# Patient Record
Sex: Female | Born: 1960 | Race: White | Hispanic: No | Smoking: Never smoker
Health system: Southern US, Community
[De-identification: ages and names within clinical notes are randomized; demographics above are authoritative.]

## PROBLEM LIST (undated history)

## (undated) DIAGNOSIS — E785 Hyperlipidemia, unspecified: Secondary | ICD-10-CM

## (undated) DIAGNOSIS — Z8619 Personal history of other infectious and parasitic diseases: Secondary | ICD-10-CM

## (undated) DIAGNOSIS — G43009 Migraine without aura, not intractable, without status migrainosus: Secondary | ICD-10-CM

## (undated) DIAGNOSIS — E663 Overweight: Secondary | ICD-10-CM

## (undated) HISTORY — DX: Overweight: E66.3

## (undated) HISTORY — DX: Hyperlipidemia, unspecified: E78.5

## (undated) HISTORY — PX: TUBAL LIGATION: SHX77

## (undated) HISTORY — PX: BREAST BIOPSY: SHX20

## (undated) HISTORY — DX: Migraine without aura, not intractable, without status migrainosus: G43.009

## (undated) HISTORY — DX: Personal history of other infectious and parasitic diseases: Z86.19

---

## 2005-08-04 ENCOUNTER — Ambulatory Visit: Payer: Self-pay | Admitting: General Surgery

## 2006-08-16 ENCOUNTER — Ambulatory Visit: Payer: Self-pay | Admitting: General Surgery

## 2007-08-17 ENCOUNTER — Ambulatory Visit: Payer: Self-pay | Admitting: General Surgery

## 2008-09-11 ENCOUNTER — Ambulatory Visit: Payer: Self-pay | Admitting: General Surgery

## 2009-09-12 ENCOUNTER — Ambulatory Visit: Payer: Self-pay | Admitting: General Surgery

## 2010-07-12 ENCOUNTER — Emergency Department: Payer: Self-pay | Admitting: Emergency Medicine

## 2010-09-14 ENCOUNTER — Ambulatory Visit: Payer: Self-pay | Admitting: General Surgery

## 2011-10-11 LAB — HM DIABETES FOOT EXAM

## 2011-10-12 ENCOUNTER — Ambulatory Visit: Payer: Self-pay | Admitting: General Surgery

## 2011-10-12 LAB — HM PAP SMEAR: HM Pap smear: NORMAL

## 2012-05-09 ENCOUNTER — Emergency Department: Payer: Self-pay | Admitting: Emergency Medicine

## 2012-07-26 HISTORY — PX: CERVICAL POLYPECTOMY: SHX88

## 2013-05-30 DIAGNOSIS — B0052 Herpesviral keratitis: Secondary | ICD-10-CM | POA: Insufficient documentation
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
## 2014-12-23 ENCOUNTER — Encounter: Payer: Self-pay | Admitting: Family Medicine

## 2014-12-23 ENCOUNTER — Encounter (INDEPENDENT_AMBULATORY_CARE_PROVIDER_SITE_OTHER): Payer: Self-pay

## 2014-12-23 ENCOUNTER — Other Ambulatory Visit: Payer: Self-pay | Admitting: Family Medicine

## 2014-12-23 ENCOUNTER — Ambulatory Visit (INDEPENDENT_AMBULATORY_CARE_PROVIDER_SITE_OTHER): Payer: BLUE CROSS/BLUE SHIELD | Admitting: Family Medicine

## 2014-12-23 VITALS — BP 122/74 | HR 75 | Temp 98.5°F | Resp 14 | Ht 64.0 in | Wt 167.5 lb

## 2014-12-23 DIAGNOSIS — R635 Abnormal weight gain: Secondary | ICD-10-CM | POA: Diagnosis not present

## 2014-12-23 DIAGNOSIS — J309 Allergic rhinitis, unspecified: Secondary | ICD-10-CM | POA: Insufficient documentation

## 2014-12-23 DIAGNOSIS — Z8619 Personal history of other infectious and parasitic diseases: Secondary | ICD-10-CM | POA: Insufficient documentation

## 2014-12-23 DIAGNOSIS — E785 Hyperlipidemia, unspecified: Secondary | ICD-10-CM

## 2014-12-23 DIAGNOSIS — F329 Major depressive disorder, single episode, unspecified: Secondary | ICD-10-CM | POA: Insufficient documentation

## 2014-12-23 DIAGNOSIS — Z0000 Encounter for general adult medical examination without abnormal findings: Secondary | ICD-10-CM

## 2014-12-23 DIAGNOSIS — Z1239 Encounter for other screening for malignant neoplasm of breast: Secondary | ICD-10-CM | POA: Diagnosis not present

## 2014-12-23 DIAGNOSIS — D229 Melanocytic nevi, unspecified: Secondary | ICD-10-CM

## 2014-12-23 DIAGNOSIS — Z7189 Other specified counseling: Secondary | ICD-10-CM | POA: Diagnosis not present

## 2014-12-23 DIAGNOSIS — Z1211 Encounter for screening for malignant neoplasm of colon: Secondary | ICD-10-CM

## 2014-12-23 DIAGNOSIS — F419 Anxiety disorder, unspecified: Secondary | ICD-10-CM

## 2014-12-23 DIAGNOSIS — Z79899 Other long term (current) drug therapy: Secondary | ICD-10-CM | POA: Diagnosis not present

## 2014-12-23 DIAGNOSIS — Z01419 Encounter for gynecological examination (general) (routine) without abnormal findings: Secondary | ICD-10-CM

## 2014-12-23 DIAGNOSIS — Z8742 Personal history of other diseases of the female genital tract: Secondary | ICD-10-CM | POA: Diagnosis not present

## 2014-12-23 DIAGNOSIS — Z124 Encounter for screening for malignant neoplasm of cervix: Secondary | ICD-10-CM

## 2014-12-23 DIAGNOSIS — Z719 Counseling, unspecified: Secondary | ICD-10-CM

## 2014-12-23 DIAGNOSIS — G43009 Migraine without aura, not intractable, without status migrainosus: Secondary | ICD-10-CM | POA: Insufficient documentation

## 2014-12-23 DIAGNOSIS — G4700 Insomnia, unspecified: Secondary | ICD-10-CM | POA: Insufficient documentation

## 2014-12-23 DIAGNOSIS — J4520 Mild intermittent asthma, uncomplicated: Secondary | ICD-10-CM | POA: Insufficient documentation

## 2014-12-23 MED ORDER — LORCASERIN HCL 10 MG PO TABS: 1.0000 | ORAL_TABLET | Freq: Two times a day (BID) | ORAL | Status: DC

## 2014-12-23 NOTE — Patient Instructions (Signed)

## 2014-12-23 NOTE — Progress Notes (Signed)
Name: Gail Ross   MRN: [id number]    DOB: 1961-01-03   Date:12/23/2014       Progress Note  Subjective  Chief Complaint  Chief Complaint  Patient presents with  . Annual Exam    HPI   Well Woman exam: she has noticed that since she gained weight she has been feeling more tired.  She dislikes the fact that her job makes her more sedentary and she has also noticed that she has been snacking more and has been getting frustrated about it. Drinking mostly water.  She has not been sexually active since 2013 .  Patient Active Problem List   Diagnosis Date Noted  . Migraine without aura, not intractable 12/23/2014  . Mild intermittent asthma 12/23/2014  . Overweight 12/23/2014  . Anxiety and depression 12/23/2014  . Controlled insomnia 12/23/2014  . Hyperlipidemia 12/23/2014  . Allergic rhinitis 12/23/2014  . History of shingles 12/23/2014  . History of postmenopausal bleeding 12/23/2014    Past Surgical History  Procedure Laterality Date  . Tubal ligation    . Cervical polypectomy  07/26/12  . Breast biopsy      Family History  Problem Relation Age of Onset  . Hypertension Mother   . Heart attack Father     History   Social History  . Marital Status: Married    Spouse Name: N/A  . Number of Children: N/A  . Years of Education: N/A   Occupational History  . Not on file.   Social History Main Topics  . Smoking status: Never Smoker   . Smokeless tobacco: Never Used  . Alcohol Use: 0.0 oz/week    0 Standard drinks or equivalent per week     Comment: occasionally  . Drug Use: No  . Sexual Activity: Not Currently   Other Topics Concern  . Not on file   Social History Narrative     Current outpatient prescriptions:  .  cetirizine (ZYRTEC) 10 MG tablet, Take 10 mg by mouth daily., Disp: , Rfl:  .  montelukast (SINGULAIR) 10 MG tablet, Take 10 mg by mouth at bedtime., Disp: , Rfl:  .  albuterol (VENTOLIN HFA) 108 (90 BASE) MCG/ACT inhaler, Inhale 2 puffs  into the lungs 30 (thirty) minutes before procedure., Disp: , Rfl:  .  Fluticasone-Salmeterol (ADVAIR) 250-50 MCG/DOSE AEPB, Inhale 1 puff into the lungs 2 (two) times daily., Disp: , Rfl:  .  SUMAtriptan Succinate Refill 6 MG/0.5ML SOCT, Inject 6 mg into the skin daily., Disp: , Rfl: 0  Allergies  Allergen Reactions  . Penicillins   . Tree Extract      ROS  Constitutional: Negative for fever but has noticed  weight gain  Respiratory: Negative for cough and shortness of breath.   Cardiovascular: Negative for chest pain or palpitations.  Gastrointestinal: Negative for abdominal pain, no bowel changes.  Musculoskeletal: Negative for gait problem or joint swelling.  Skin: Negative for rash.  Neurological: Negative for dizziness. Headache about 4 times monthly.  No other specific complaints in a complete review of systems (except as listed in HPI above).  Objective  Filed Vitals:   12/23/14 1351  BP: 122/74  Pulse: 75  Temp: 98.5 F (36.9 C)  TempSrc: Oral  Resp: 14  Height: 5\' 4"  (1.626 m)  Weight: 167 lb 8 oz (75.978 kg)  SpO2: 96%    Body mass index is 28.74 kg/(m^2).  Physical Exam   Constitutional: Patient appears well-developed and well-nourished. No distress.  HENT: Head:  Normocephalic and atraumatic. Ears: B TMs ok, no erythema or effusion; Nose: Nose normal. Mouth/Throat: Oropharynx is clear and moist. No oropharyngeal exudate.  Eyes: Conjunctivae and EOM are normal. Pupils are equal, round, and reactive to light. No scleral icterus.  Neck: Normal range of motion. Neck supple. No JVD present. No thyromegaly present.  Cardiovascular: Normal rate, regular rhythm and normal heart sounds.  No murmur heard. No BLE edema. Pulmonary/Chest: Effort normal and breath sounds normal. No respiratory distress. Abdominal: Soft. Bowel sounds are normal, no distension. There is no tenderness. no masses Breast: no lumps or masses, no nipple discharge or rashes FEMALE GENITALIA:   External genitalia normal External urethra normal Vaginal vault normal without discharge or lesions Cervix normal without discharge or lesions Bimanual exam normal without masses Musculoskeletal: Normal range of motion, no joint effusions. No gross deformities Neurological: he is alert and oriented to person, place, and time. No cranial nerve deficit. Coordination, balance, strength, speech and gait are normal.  Skin: Skin is warm and dry. No rash noted. No erythema.  Psychiatric: Patient has a normal mood and affect. behavior is normal. Judgment and thought content normal.      PHQ2/9: Depression screen PHQ 2/9 12/23/2014  Decreased Interest 0  Down, Depressed, Hopeless 0  PHQ - 2 Score 0     Fall Risk: Fall Risk  12/23/2014  Falls in the past year? No    Assessment & Plan  1. Well woman exam Discussed health counseling  2. Health counseling  Discussed importance of 150 minutes of physical activity weekly, eat two servings of fish weekly, eat one serving of tree nuts ( cashews, pistachios, pecans, almonds.Marland Kitchen) every other day, eat 6 servings of fruit/vegetables daily and drink plenty of water and avoid sweet beverages. Start asrpin 81 mg daily    3. Colon cancer screening  - Ambulatory referral to Gastroenterology  4. Breast cancer screening  - MM Digital Diagnostic Bilat; Future   5. Cervical cancer screening - Cytology - PAP  6. History of postmenopausal bleeding Neg evaluation by gyn  7. Weight gain  - TSH, she is overweight, and wants to take medication for weight loss, her BMI 28 but she has hyperlipidemia as a risk factor, discussed options , risk and benefit, increase physical activity and follow up in 6 weeks  8. Encounter for long-term (current) use of medications  - Comprehensive Metabolic Panel (CMET)  9. Hyperlipidemia  - Lipid Profile  10. Numerous moles  Dr. Koleen Nimrod

## 2014-12-27 LAB — PAP LB, RFX HPV ASCU: PAP SMEAR COMMENT: 0

## 2015-01-03 ENCOUNTER — Telehealth: Payer: Self-pay

## 2015-01-03 NOTE — Telephone Encounter (Signed)
LVM for pt to return my call to schedule colonoscopy.  

## 2015-01-06 NOTE — Telephone Encounter (Signed)
Mailed letter requesting pt to call and schedule colonoscopy.

## 2015-02-27 ENCOUNTER — Ambulatory Visit: Payer: Self-pay | Admitting: Family Medicine

## 2015-03-12 ENCOUNTER — Ambulatory Visit: Payer: Self-pay | Admitting: Family Medicine

## 2015-07-14 ENCOUNTER — Other Ambulatory Visit: Payer: Self-pay | Admitting: Family Medicine

## 2015-07-15 NOTE — Telephone Encounter (Signed)
Patient requesting refill. 

## 2015-12-18 ENCOUNTER — Encounter: Payer: Self-pay | Admitting: Podiatry

## 2015-12-18 ENCOUNTER — Ambulatory Visit (INDEPENDENT_AMBULATORY_CARE_PROVIDER_SITE_OTHER): Payer: BLUE CROSS/BLUE SHIELD | Admitting: Podiatry

## 2015-12-18 ENCOUNTER — Ambulatory Visit (INDEPENDENT_AMBULATORY_CARE_PROVIDER_SITE_OTHER): Payer: BLUE CROSS/BLUE SHIELD

## 2015-12-18 VITALS — BP 108/74 | HR 105 | Resp 18

## 2015-12-18 DIAGNOSIS — M205X1 Other deformities of toe(s) (acquired), right foot: Secondary | ICD-10-CM | POA: Diagnosis not present

## 2015-12-18 DIAGNOSIS — R52 Pain, unspecified: Secondary | ICD-10-CM

## 2015-12-18 DIAGNOSIS — M779 Enthesopathy, unspecified: Secondary | ICD-10-CM

## 2015-12-18 NOTE — Patient Instructions (Signed)
1st metatarsophalangeal (MTPJ) joint implant for hallux limitus

## 2015-12-18 NOTE — Progress Notes (Signed)
   Subjective:    Patient ID: Gail Ross, female    DOB: [birth year], 55 y.o.   MRN: [id number]  HPI  55 year old female presents to the office for concerns of a possible bunion to her right foot which is been ongoing for about 1 year and has been progressive. She gets pain to the big toe joint which he tries to move her toe and she is unable to wear healed shoes or flat shoe. She has had no recent injury. No numbness or tingling. No recent treatment. No other complaints at this time.  Review of Systems  All other systems reviewed and are negative.      Objective:   Physical Exam Ross: AAO x3, NAD  Dermatological: Skin is warm, dry and supple bilateral. Nails x 10 are well manicured; remaining integument appears unremarkable at this time. There are no open sores, no preulcerative lesions, no rash or signs of infection present.  Vascular: Dorsalis Pedis artery and Posterior Tibial artery pedal pulses are 2/4 bilateral with immedate capillary fill time. Pedal hair growth present.  There is no pain with calf compression, swelling, warmth, erythema.   Neruologic: Grossly intact via light touch bilateral. Vibratory intact via tuning fork bilateral. Protective threshold with Semmes Wienstein monofilament intact to all pedal sites bilateral.  Musculoskeletal: There is a decreased range of motion of the right first MTPJ and there is crepitation with MPJ range of motion. There is a palpable spur off the dorsal aspect of the first metatarsal head. There is no erythema or increase in warmth. There is tenderness the first MPJ. No other areas of tenderness bilaterally.   Gait: Unassisted, Nonantalgic.      Assessment & Plan:  Right first MTPJ hallux limitus, capsulitis -Treatment options discussed including all alternatives, risks, and complications -Etiology of symptoms were discussed -X-rays were obtained and reviewed with the patient. Arthritic changes present in the first  MPJ. -Discussed steroid injection the joint for which she wishes to proceed with this. Under sterile conditions a mixture of dexamethasone phosphate and local anesthetic was infiltrated without, occasions. Post injection care was discussed -Discussed orthotics but she wishes to hold off -Discussed shoe gear modifications and padding. -Discussed surgical intervention she will consider this in the future should symptoms persist. -Follow-up as scheduled or sooner if needed.  Celesta Gentile, DPM

## 2015-12-21 DIAGNOSIS — M205X9 Other deformities of toe(s) (acquired), unspecified foot: Secondary | ICD-10-CM | POA: Insufficient documentation

## 2015-12-25 ENCOUNTER — Other Ambulatory Visit: Payer: Self-pay | Admitting: Family Medicine

## 2015-12-25 NOTE — Telephone Encounter (Signed)
Patient requesting refill. 

## 2016-01-05 ENCOUNTER — Telehealth: Payer: Self-pay | Admitting: *Deleted

## 2016-01-05 NOTE — Telephone Encounter (Signed)
"  My name is Gail Ross.  Thank you.

## 2016-01-06 NOTE — Telephone Encounter (Signed)
I'm returning your call from yesterday.  "Yes, I would like to go ahead and set up my surgery.  I'm going to see him on Thursday to discuss it.  Do I need to wait and see him to schedule?"  It would be best to wait until you see him to get it scheduled.  "Will they schedule it that day or will I need to call you to set it up?"  They will probably bring me the surgery information on Friday.  "So will I call you to schedule or will you call me?"  You can call me to schedule the date.  "Can you at least give me an idea of when his next available date is?"  His next available date is July 12.  "I guess I will have to wait until then.  Thank you so much."

## 2016-01-08 ENCOUNTER — Encounter: Payer: Self-pay | Admitting: Podiatry

## 2016-01-08 ENCOUNTER — Ambulatory Visit (INDEPENDENT_AMBULATORY_CARE_PROVIDER_SITE_OTHER): Payer: BLUE CROSS/BLUE SHIELD | Admitting: Podiatry

## 2016-01-08 DIAGNOSIS — M205X1 Other deformities of toe(s) (acquired), right foot: Secondary | ICD-10-CM

## 2016-01-08 DIAGNOSIS — M779 Enthesopathy, unspecified: Secondary | ICD-10-CM

## 2016-01-08 NOTE — Patient Instructions (Signed)

## 2016-01-09 NOTE — Progress Notes (Signed)
Patient ID: Gail Ross, female   DOB: 1961-02-26, 55 y.o.   MRN: [id number]  Subjective: 55 year old female presents the office sooner. Evaluation of right first MTPJ pain. She states the injection did not help. She continues to have pain to the joint when she walks on daily basis. This time should proceed with surgical intervention. She has tried conservative treatment including shoe gear changes, offloading of having without any relief as well as injections. Denies any systemic complaints such as fevers, chills, nausea, vomiting. No acute changes since last appointment, and no other complaints at this time.   Objective: AAO x3, NAD DP/PT pulses palpable bilaterally, CRT less than 3 seconds There is decreased range of motion the first MTPJ on the right side and there is crepitus with first MPJ range of motion. There is a decreased range of motion as well. There is a dorsal exostosis of the dorsal first MPJ. Tenderness the first MPJ. No other areas of tenderness to bilateral lower extremity is. There is localized erythema to the dorsal aspect of the joint overlying the bony exostosis from irritation shoes. No open lesions or pre-ulcerative lesions.  No pain with calf compression, swelling, warmth, erythema  Assessment:  55 year old female right hallux limitus, symptomatic   Plan: -All treatment options discussed with the patient including all alternatives, risks, complications.  -At this time a discussed both conservative and surgical treatment options. She wishes to proceed with surgical intervention. I discussed arthrodesis versus arthroplasty with implant. At this point she does not want have her toe. We will proceed with an implant arthroplasty.  -The incision placement as well as the postoperative course was discussed with the patient. I discussed risks of the surgery which include, but not limited to, infection, bleeding, pain, swelling, need for further surgery, delayed or nonhealing,  painful or ugly scar, numbness or sensation changes, over/under correction, recurrence, transfer lesions, further deformity, hardware failure, DVT/PE, loss of toe/foot. Patient understands these risks and wishes to proceed with surgery. The surgical consent was reviewed with the patient all 3 pages were signed. No promises or guarantees were given to the outcome of the procedure. All questions were answered to the best of my ability. Before the surgery the patient was encouraged to call the office if there is any further questions. The surgery will be performed at the [facility] on an outpatient basis. -Cam boot dispensed.  Celesta Gentile, DPM -Patient encouraged to call the office with any questions, concerns, change in symptoms.

## 2016-01-19 DIAGNOSIS — M79673 Pain in unspecified foot: Secondary | ICD-10-CM

## 2016-01-21 ENCOUNTER — Encounter: Payer: Self-pay | Admitting: Podiatry

## 2016-01-21 DIAGNOSIS — M2021 Hallux rigidus, right foot: Secondary | ICD-10-CM | POA: Diagnosis not present

## 2016-01-21 HISTORY — PX: OTHER SURGICAL HISTORY: SHX169

## 2016-01-23 ENCOUNTER — Telehealth: Payer: Self-pay | Admitting: *Deleted

## 2016-01-23 ENCOUNTER — Other Ambulatory Visit: Payer: Self-pay | Admitting: Sports Medicine

## 2016-01-23 DIAGNOSIS — M79673 Pain in unspecified foot: Secondary | ICD-10-CM

## 2016-01-23 MED ORDER — OXYCODONE-ACETAMINOPHEN 10-325 MG PO TABS: 1.0000 | ORAL_TABLET | ORAL | Status: DC | PRN

## 2016-01-23 NOTE — Progress Notes (Signed)
DOS 01/21/2016 Right foot removal of big toe joint with implant.

## 2016-01-23 NOTE — Telephone Encounter (Addendum)
Post op courtesy call-Pt states her foot is throbbing and the Percocet is not touching the pain.  I instructed pt to remove the air fracture walker, open-ended sock, and ace wrap only, elevate the foot for 15 minutes, if pain worsens dangle foot 15 minutes, then place foot level with hip and rewrap the ace looser. I told pt I'd call again in about 15-20 minutes, pt agreed.  Follow up call, left message I would call again or she could call me. Pt called back after lunch states tried all of the comfort measures but no relief.  Dr. Jacqualyn Posey orders Percocet 10/325mg  #30 one every 4-6 hours. I spoke with pt and informed that Dr. Jacqualyn Posey had increase the Percocet strength to Percocet 10/353m one tablet every 4-6 hours and she would need to pick it up in the [place] office before 300pm.  I routed message to Dr. Cannon Kettle of pt presenting for changed rx. I spoke with [facility] and informed that pt would have an increase in her Percocet strength and our doctor had ordered.

## 2016-01-23 NOTE — Telephone Encounter (Signed)
Will do!

## 2016-01-29 ENCOUNTER — Encounter: Payer: Self-pay | Admitting: Podiatry

## 2016-01-29 ENCOUNTER — Ambulatory Visit (INDEPENDENT_AMBULATORY_CARE_PROVIDER_SITE_OTHER): Payer: BLUE CROSS/BLUE SHIELD

## 2016-01-29 ENCOUNTER — Ambulatory Visit (INDEPENDENT_AMBULATORY_CARE_PROVIDER_SITE_OTHER): Payer: BLUE CROSS/BLUE SHIELD | Admitting: Podiatry

## 2016-01-29 DIAGNOSIS — M205X1 Other deformities of toe(s) (acquired), right foot: Secondary | ICD-10-CM | POA: Diagnosis not present

## 2016-01-29 DIAGNOSIS — Z9889 Other specified postprocedural states: Secondary | ICD-10-CM

## 2016-01-29 NOTE — Progress Notes (Signed)
Patient ID: Gail Ross, female   DOB: 05/17/1961, 55 y.o.   MRN: [id number]  Subjective: Gail Ross is a 55 y.o. is seen today in office s/p right foot 1st MTPJ implant arthroplasty preformed on 01/21/16. She states that her pain is much better controlled. She is taking maybe 1 Percocet a day if needed. She does take the boot off to rest and this also helps with the pain.  Denies any systemic complaints such as fevers, chills, nausea, vomiting. No calf pain, chest pain, shortness of breath.   Objective: General: No acute distress, AAOx3  DP/PT pulses palpable 2/4, CRT < 3 sec to all digits.  Protective sensation intact. Motor function intact.  Right foot: Incision is well coapted without any evidence of dehiscence and sutures intact. There is no surrounding erythema, ascending cellulitis, fluctuance, crepitus, malodor, drainage/purulence. There is minimal edema around the surgical site. There is mild pain along the surgical site. Slight discomfort with 1st MTPJ ROM but she states it feels different than before surgery.  No other areas of tenderness to bilateral lower extremities.  No other open lesions or pre-ulcerative lesions.  No pain with calf compression, swelling, warmth, erythema.   Assessment and Plan:  Status post right foot surgery, doing well with no complications   -Treatment options discussed including all alternatives, risks, and complications -X-rays were obtained and reviewed with the patient. Implant arthroplasty evident the right first MPJ. Hardware appears intact. No evidence of acute fracture. -Antibiotic ointment was applied over the incision followed by dressing. Keep dressing clean, dry, intact. -Ice/elevation -Pain medication as needed. -Monitor for any clinical signs or symptoms of infection and DVT/PE and directed to call the office immediately should any occur or go to the ER. -Follow-up in 1 week for suture removal or sooner if any problems arise. In the  meantime, encouraged to call the office with any questions, concerns, change in symptoms.  *surgical shoe next appointment  Celesta Gentile, DPM

## 2016-02-05 ENCOUNTER — Encounter: Payer: Self-pay | Admitting: Podiatry

## 2016-02-05 ENCOUNTER — Ambulatory Visit (INDEPENDENT_AMBULATORY_CARE_PROVIDER_SITE_OTHER): Payer: BLUE CROSS/BLUE SHIELD | Admitting: Podiatry

## 2016-02-05 ENCOUNTER — Ambulatory Visit (INDEPENDENT_AMBULATORY_CARE_PROVIDER_SITE_OTHER): Payer: BLUE CROSS/BLUE SHIELD

## 2016-02-05 ENCOUNTER — Telehealth: Payer: Self-pay | Admitting: Podiatry

## 2016-02-05 VITALS — BP 121/69 | HR 88 | Resp 12

## 2016-02-05 DIAGNOSIS — M205X1 Other deformities of toe(s) (acquired), right foot: Secondary | ICD-10-CM

## 2016-02-05 DIAGNOSIS — Z9889 Other specified postprocedural states: Secondary | ICD-10-CM

## 2016-02-05 MED ORDER — CLINDAMYCIN HCL 300 MG PO CAPS: 300.0000 mg | ORAL_CAPSULE | Freq: Three times a day (TID) | ORAL | 2 refills | Status: DC

## 2016-02-05 NOTE — Telephone Encounter (Signed)
Patient came in today and said that [employer] has faxed you paperwork to complete for Gail Ross and return with office notes/ records etc and they have not received them. Patient said they will discontinue her FMLA and pay on Tuesday if they dont get this information this week. Please call patient to discuss.  [employer] fax number is [phone] and the claim # is [id number]. Thank you!

## 2016-02-05 NOTE — Telephone Encounter (Signed)
The forms were completed and faxed on 02/03/16
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
## 2016-02-09 ENCOUNTER — Telehealth: Payer: Self-pay | Admitting: Podiatry

## 2016-02-12 NOTE — Telephone Encounter (Signed)
Sent to [employer] to fax over office notes to her carrier.

## 2016-02-19 ENCOUNTER — Telehealth: Payer: Self-pay | Admitting: Podiatry

## 2016-02-19 ENCOUNTER — Ambulatory Visit (INDEPENDENT_AMBULATORY_CARE_PROVIDER_SITE_OTHER): Payer: BLUE CROSS/BLUE SHIELD | Admitting: Podiatry

## 2016-02-19 ENCOUNTER — Ambulatory Visit (INDEPENDENT_AMBULATORY_CARE_PROVIDER_SITE_OTHER): Payer: BLUE CROSS/BLUE SHIELD

## 2016-02-19 DIAGNOSIS — Z09 Encounter for follow-up examination after completed treatment for conditions other than malignant neoplasm: Secondary | ICD-10-CM

## 2016-02-19 DIAGNOSIS — M205X1 Other deformities of toe(s) (acquired), right foot: Secondary | ICD-10-CM

## 2016-02-19 NOTE — Telephone Encounter (Signed)
Patient was seen by dr. Jacqualyn Posey today and she would like you to send office notes... Etc... ( whatever you normally send to [facility]) on her behalf to extend her. She will return in 3 weeks on 03/11/16 for PO #4. Thanks!

## 2016-02-19 NOTE — Progress Notes (Signed)
Patient ID: Gail Ross, female   DOB: 1961/03/23, 55 y.o.   MRN: [id number]  Subjective: Gail Ross is a 55 y.o. is seen today in office s/p right foot 1st MTPJ implant arthroplasty preformed on 01/21/16. She states that she is doing well. She still has some numbness over the big toe. She is not taking pain medication. She has continued with the surgery shoe. Denies any systemic complaints such as fevers, chills, nausea, vomiting. No calf pain, chest pain, shortness of breath.   Objective: General: No acute distress, AAOx3  DP/PT pulses palpable 2/4, CRT < 3 sec to all digits.  Protective sensation intact. Motor function intact.  Right foot: Incision is well coapted without any evidence of dehiscence and a scar has formed. There is no surrounding erythema or a a sitting cellulitis. There is no fluctuance or crepitus. No malodor or drainage. Subjective numbness in the dorsal aspect of the surgical site. No pain with MPJ range of motion. There is no blistering formation today there is no clinical signs of infection. The area of previous concern has resolved.  No other areas of tenderness to bilateral lower extremities.  No other open lesions or pre-ulcerative lesions.  No pain with calf compression, swelling, warmth, erythema.   Assessment and Plan:  Status post right foot surgery  -Treatment options discussed including all alternatives, risks, and complications -x-rays obtained today. No evidence of acute fracture. Hardware intact. -At this time she is to transition to a regular shoe as tolerated. She's been a sneaker now high-heeled her dress shoes. -Range of motion exercises. -At this time recommended her to hold off on return to work as she is unable to wear a work shoe. -Continue with elevation and ice -Follow-up in 3 weeks or sooner if needed. Call the questions concerns.  Celesta Gentile, DPM

## 2016-03-09 ENCOUNTER — Other Ambulatory Visit: Payer: Self-pay | Admitting: Family Medicine

## 2016-03-09 NOTE — Telephone Encounter (Signed)
Last seen 12/26/14 last filled 06/17 50ml 0 refills

## 2016-03-10 NOTE — Telephone Encounter (Signed)
GAVE PATIENT APPT

## 2016-03-11 ENCOUNTER — Ambulatory Visit (INDEPENDENT_AMBULATORY_CARE_PROVIDER_SITE_OTHER): Payer: BLUE CROSS/BLUE SHIELD | Admitting: Podiatry

## 2016-03-11 ENCOUNTER — Encounter: Payer: Self-pay | Admitting: Podiatry

## 2016-03-11 ENCOUNTER — Ambulatory Visit (INDEPENDENT_AMBULATORY_CARE_PROVIDER_SITE_OTHER): Payer: BLUE CROSS/BLUE SHIELD

## 2016-03-11 DIAGNOSIS — M205X1 Other deformities of toe(s) (acquired), right foot: Secondary | ICD-10-CM

## 2016-03-11 DIAGNOSIS — M2021 Hallux rigidus, right foot: Secondary | ICD-10-CM | POA: Diagnosis not present

## 2016-03-11 DIAGNOSIS — Z09 Encounter for follow-up examination after completed treatment for conditions other than malignant neoplasm: Secondary | ICD-10-CM

## 2016-03-15 NOTE — Progress Notes (Signed)
Patient ID: Gail Ross, female   DOB: 05-01-1961, 55 y.o.   MRN: [id number]  Subjective: Gail Ross is a 55 y.o. is seen today in office s/p right foot 1st MTPJ implant arthroplasty preformed on 01/21/16. She states that she is doing well and feels that she feels much improved than she did prior to surgery and she is glad that she did the surgery. She has not yet returned to work. She is wearing a slip on shoe.Denies any systemic complaints such as fevers, chills, nausea, vomiting. No calf pain, chest pain, shortness of breath.   Objective: General: No acute distress, AAOx3  DP/PT pulses palpable 2/4, CRT < 3 sec to all digits.  Protective sensation intact. Motor function intact.  Right foot: Incision is well coapted without any evidence of dehiscence and a scar has formed. There is no surrounding erythema or a a sitting cellulitis. There is no fluctuance or crepitus. No malodor or drainage. The toe/dorsal incision is improving. No pain with MPJ range of motion. The surrounding skin is a normal in appearance. No other areas of tenderness to bilateral lower extremities.  No other open lesions or pre-ulcerative lesions.  No pain with calf compression, swelling, warmth, erythema.   Assessment and Plan:  Status post right foot surgery  -Treatment options discussed including all alternatives, risks, and complications -X-rays obtained today. No evidence of acute fracture. Implant intact. -Continue range of motion exercises. She can transition to regular shoe as tolerated. Continue ice and elevation. She is scheduled about a work in the next 2 weeks. Follow-up with me as scheduled or sooner if needed.  Celesta Gentile, DPM

## 2016-04-05 ENCOUNTER — Other Ambulatory Visit: Payer: Self-pay

## 2016-04-05 ENCOUNTER — Encounter: Payer: Self-pay | Admitting: Family Medicine

## 2016-04-05 ENCOUNTER — Ambulatory Visit (INDEPENDENT_AMBULATORY_CARE_PROVIDER_SITE_OTHER): Payer: BLUE CROSS/BLUE SHIELD | Admitting: Family Medicine

## 2016-04-05 VITALS — BP 122/68 | HR 62 | Temp 98.8°F | Resp 16 | Ht 64.0 in | Wt 180.1 lb

## 2016-04-05 DIAGNOSIS — G43009 Migraine without aura, not intractable, without status migrainosus: Secondary | ICD-10-CM | POA: Diagnosis not present

## 2016-04-05 DIAGNOSIS — E669 Obesity, unspecified: Secondary | ICD-10-CM | POA: Diagnosis not present

## 2016-04-05 DIAGNOSIS — R5383 Other fatigue: Secondary | ICD-10-CM

## 2016-04-05 DIAGNOSIS — J4541 Moderate persistent asthma with (acute) exacerbation: Secondary | ICD-10-CM | POA: Diagnosis not present

## 2016-04-05 DIAGNOSIS — H9192 Unspecified hearing loss, left ear: Secondary | ICD-10-CM

## 2016-04-05 DIAGNOSIS — Z1211 Encounter for screening for malignant neoplasm of colon: Secondary | ICD-10-CM | POA: Diagnosis not present

## 2016-04-05 DIAGNOSIS — E785 Hyperlipidemia, unspecified: Secondary | ICD-10-CM

## 2016-04-05 DIAGNOSIS — R635 Abnormal weight gain: Secondary | ICD-10-CM

## 2016-04-05 DIAGNOSIS — Z23 Encounter for immunization: Secondary | ICD-10-CM | POA: Diagnosis not present

## 2016-04-05 DIAGNOSIS — Z131 Encounter for screening for diabetes mellitus: Secondary | ICD-10-CM

## 2016-04-05 DIAGNOSIS — Z79899 Other long term (current) drug therapy: Secondary | ICD-10-CM | POA: Diagnosis not present

## 2016-04-05 DIAGNOSIS — Z1159 Encounter for screening for other viral diseases: Secondary | ICD-10-CM

## 2016-04-05 DIAGNOSIS — Z1239 Encounter for other screening for malignant neoplasm of breast: Secondary | ICD-10-CM

## 2016-04-05 MED ORDER — SUMATRIPTAN SUCCINATE 6 MG/0.5ML ~~LOC~~ SOAJ: SUBCUTANEOUS | 1 refills | Status: DC

## 2016-04-05 MED ORDER — ALBUTEROL SULFATE HFA 108 (90 BASE) MCG/ACT IN AERS: 2.0000 | INHALATION_SPRAY | Freq: Four times a day (QID) | RESPIRATORY_TRACT | 0 refills | Status: DC | PRN

## 2016-04-05 MED ORDER — LIRAGLUTIDE -WEIGHT MANAGEMENT 18 MG/3ML ~~LOC~~ SOPN: 3.0000 mg | PEN_INJECTOR | Freq: Every day | SUBCUTANEOUS | 2 refills | Status: DC

## 2016-04-05 MED ORDER — FLUTICASONE-SALMETEROL 250-50 MCG/DOSE IN AEPB: 1.0000 | INHALATION_SPRAY | Freq: Two times a day (BID) | RESPIRATORY_TRACT | 2 refills | Status: DC

## 2016-04-05 MED ORDER — PREDNISONE 10 MG (48) PO TBPK: ORAL_TABLET | Freq: Every day | ORAL | 0 refills | Status: DC

## 2016-04-05 MED ORDER — ALBUTEROL SULFATE (2.5 MG/3ML) 0.083% IN NEBU
2.5000 mg | INHALATION_SOLUTION | Freq: Once | RESPIRATORY_TRACT | Status: AC
  Administered 2016-04-05: 2.5 mg via RESPIRATORY_TRACT

## 2016-04-05 NOTE — Progress Notes (Addendum)
Name: Gail Ross   MRN: [id number]    DOB: 02/18/1961   Date:04/05/2016       Progress Note  Subjective  Chief Complaint  Chief Complaint  Patient presents with  . Medication Refill    6 month F/U  . Asthma    Patient has been having a flair up begining in August and has been having Wheezing that wakes her up at night, SOB and coughing.   . Otalgia    Onset-1 month and bilateral ears, but states the left ear is worst than the right side. Patient is unable to hear anything out of her left ear.   . Migraine    Patient has been without medication for the past month and has had 4 migraines.   . Obesity    Patient Insurance would not cover Belviq and gained 19 pounds since last visit.     HPI  Asthma Moderate: sees Dr. Tami Ribas and Advair and Ventolin stopped, she is not sure why, however since that time asthma has been out of control, wheezing daily, SOB with activity and a dry cough. She would like to resume medication  Migraine : she states migraines are sporadic, skipped two months this Summer, however had 4 this month, episodes are described as throbbing, sharp, severe, associated with photophobia, nausea and sometimes vomiting. She could not tolerate topamax and does not want to try anything else for prevention.  She prefers injectable Imitrex, because of quicker onset of action  Obesity: she has gained 18 lbs in the past 15 months, she states Belviq was not covered by insurance, she took for one month only but not sure if it worked at the time. She states she feels hungry all the time. She eats breakfast daily. She states she eats 3 fast food meals per week, she stopped drinking sweet tea this week, she states less active because of recent foot surgery.   Hearing loss: she has noticed decreased in hearing on left side , symptoms started with a little crackling sensation on right side, followed by hearing loss, unable to hear from left side. She denies cold symptoms before hand.    Hyperlipidemia: not on medication , needs to have labs done  HSV keratitis: sees Ophthalmologist at [facility], under control on Valtrex  Patient Active Problem List   Diagnosis Date Noted  . Obesity 04/05/2016  . Hallux limitus 12/21/2015  . Migraine without aura, not intractable 12/23/2014  . Mild intermittent asthma 12/23/2014  . Overweight 12/23/2014  . Controlled insomnia 12/23/2014  . Hyperlipidemia 12/23/2014  . Allergic rhinitis 12/23/2014  . History of shingles 12/23/2014  . History of postmenopausal bleeding 12/23/2014  . 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 BREAST BIOPSY    . CERVICAL POLYPECTOMY  07/26/12  . halux implant arthro Right 01/21/2016   Dr. Jacqualyn Posey  . TUBAL LIGATION      Family History  Problem Relation Age of Onset  . Hypertension Mother   . Heart attack Father     Social History   Social History  . Marital status: Married    Spouse name: N/A  . Number of children: N/A  . Years of education: N/A   Occupational History  . Not on file.   Social History Main Topics  . Smoking status: Never Smoker  . Smokeless tobacco: Never Used  . Alcohol use 0.0 oz/week     Comment: occasionally  . Drug  use: No  . Sexual activity: Not Currently   Other Topics Concern  . Not on file   Social History Narrative  . No narrative on file     Current Outpatient Prescriptions:  .  albuterol (VENTOLIN HFA) 108 (90 Base) MCG/ACT inhaler, Inhale 2 puffs into the lungs every 6 (six) hours as needed for wheezing or shortness of breath., Disp: 1 Inhaler, Rfl: 0 .  cetirizine (ZYRTEC) 10 MG tablet, Take 10 mg by mouth daily., Disp: , Rfl:  .  Fluticasone-Salmeterol (ADVAIR) 250-50 MCG/DOSE AEPB, Inhale 1 puff into the lungs 2 (two) times daily., Disp: 60 each, Rfl: 2 .  montelukast (SINGULAIR) 10 MG tablet, Take 10 mg by mouth at bedtime., Disp: , Rfl:  .  SUMAtriptan 6 MG/0.5ML SOAJ, inject  1 dose subcutaneously for migraines MAX DOSE OF 1 PER 24 HOURS, Disp: 5 mL, Rfl: 1 .  valACYclovir (VALTREX) 1000 MG tablet, Take 1,000 mg by mouth. Eye doctor, Disp: , Rfl:  .  Liraglutide -Weight Management (SAXENDA) 18 MG/3ML SOPN, Inject 3 mg into the skin daily., Disp: 9 mL, Rfl: 2 .  predniSONE (STERAPRED UNI-PAK 48 TAB) 10 MG (48) TBPK tablet, Take by mouth daily. Take as directed, Disp: 48 tablet, Rfl: 0  Allergies  Allergen Reactions  . Penicillins   . Tree Extract      ROS  Constitutional: Negative for fever or weight change.  Respiratory: Negative for cough and shortness of breath.   Cardiovascular: Negative for chest pain or palpitations.  Gastrointestinal: Negative for abdominal pain, no bowel changes.  Musculoskeletal: Negative for gait problem or joint swelling.  Skin: Negative for rash.  Neurological: Negative for dizziness or headache.  No other specific complaints in a complete review of systems (except as listed in HPI above).  Objective  Vitals:   04/05/16 1109  BP: 122/68  Pulse: 62  Resp: 16  Temp: 98.8 F (37.1 C)  TempSrc: Oral  SpO2: 96%  Weight: 180 lb 1.6 oz (81.7 kg)  Height: 5\' 4"  (1.626 m)    Body mass index is 30.91 kg/m.  Physical Exam  Constitutional: Patient appears well-developed and well-nourished. Obese No distress.  HEENT: head atraumatic, normocephalic, pupils equal and reactive to light, ears normal TM bilaterally, neck supple, throat within normal limits Cardiovascular: Normal rate, regular rhythm and normal heart sounds.  No murmur heard. No BLE edema. Pulmonary/Chest: Effort normal and breath sounds normal. No respiratory distress. Abdominal: Soft.  There is no tenderness. Psychiatric: Patient has a normal mood and affect. behavior is normal. Judgment and thought content normal.  PHQ2/9: Depression screen [facility] 2/9 04/05/2016 12/23/2014  Decreased Interest 0 0  Down, Depressed, Hopeless 0 0  PHQ - 2 Score 0 0     Fall  Risk: Fall Risk  04/05/2016 12/23/2014  Falls in the past year? No No     Functional Status Survey: Is the patient deaf or have difficulty hearing?: No Does the patient have difficulty seeing, even when wearing glasses/contacts?: No Does the patient have difficulty concentrating, remembering, or making decisions?: No Does the patient have difficulty walking or climbing stairs?: No Does the patient have difficulty dressing or bathing?: No Does the patient have difficulty doing errands alone such as visiting a doctor's office or shopping?: No   Hearing Screening   Method: Audiometry   125Hz  250Hz  500Hz  1000Hz  2000Hz  3000Hz  4000Hz  6000Hz  8000Hz   Right ear:   Pass Pass Pass  Pass    Left ear:   Fail Fail  Fail  Fail    Vision Screening Comments: Patient failed dBHL on the left side of 20 and 25 with all 4 Hz. Could only hear 40 dBHL of 1000 and 2000 Hz.   Assessment & Plan   1. Hyperlipidemia  - Lipid panel  2. Migraine without aura and without status migrainosus, not intractable  - SUMAtriptan 6 MG/0.5ML SOAJ; inject 1 dose subcutaneously for migraines MAX DOSE OF 1 PER 24 HOURS  Dispense: 5 mL; Refill: 1  3. Weight gain  Discussed life style modification   4. Colon cancer screening  - Cologuard  5. Breast cancer screening  - MM Digital Screening; Future  6. Moderate persistent asthma, with acute exacerbation  Asthma not controlled since stopped Advair, spirometry today showed low FEV1/FVC resume medication  - Fluticasone-Salmeterol (ADVAIR) 250-50 MCG/DOSE AEPB; Inhale 1 puff into the lungs 2 (two) times daily.  Dispense: 60 each; Refill: 2 - albuterol (VENTOLIN HFA) 108 (90 Base) MCG/ACT inhaler; Inhale 2 puffs into the lungs every 6 (six) hours as needed for wheezing or shortness of breath.  Dispense: 1 Inhaler; Refill: 0 - PR EVAL OF BRONCHOSPASM  7. Other fatigue  - TSH - CBC with Differential/Platelet - COMPLETE METABOLIC PANEL WITH GFR - Vitamin B12 -  VITAMIN D 25 Hydroxy (Vit-D Deficiency, Fractures)  8. Need for hepatitis C screening test  - Hepatitis C antibody  9. Diabetes mellitus screening  - Hemoglobin A1c  10. Encounter for long-term (current) use of high-risk medication  - CBC with Differential/Platelet - COMPLETE METABOLIC PANEL WITH GFR  11. Obesity  - Liraglutide -Weight Management (SAXENDA) 18 MG/3ML SOPN; Inject 3 mg into the skin daily.  Dispense: 9 mL; Refill: 2 Discussed all optiosns for weight loss medications including Belviq, Qsymia, Saxenda and Contrave. Discussed risk and benefits of each of them. Discussed with the patient the risk posed by an increased BMI. Discussed importance of portion control, calorie counting and at least 150 minutes of physical activity weekly. Avoid sweet beverages and drink more water. Eat at least 6 servings of fruit and vegetables daily   12. Needs flu shot  - Flu Vaccine QUAD 36+ mos IM  13. Hearing loss, left   failed hearing test, we will give her prednisone taper and go back to Dr. Tami Ribas if no resolution

## 2016-04-07 LAB — COMPLETE METABOLIC PANEL WITH GFR
ALT: 16 U/L (ref 6–29)
AST: 14 U/L (ref 10–35)
Albumin: 4.2 g/dL (ref 3.6–5.1)
Alkaline Phosphatase: 80 U/L (ref 33–130)
BILIRUBIN TOTAL: 0.5 mg/dL (ref 0.2–1.2)
BUN: 16 mg/dL (ref 7–25)
CHLORIDE: 109 mmol/L (ref 98–110)
CO2: 18 mmol/L — AB (ref 20–31)
Calcium: 9.3 mg/dL (ref 8.6–10.4)
Creat: 0.81 mg/dL (ref 0.50–1.05)
GFR, EST NON AFRICAN AMERICAN: 81 mL/min (ref >=60)
Glucose, Bld: 113 mg/dL — ABNORMAL HIGH (ref 65–99)
POTASSIUM: 3.9 mmol/L (ref 3.5–5.3)
Sodium: 141 mmol/L (ref 135–146)
TOTAL PROTEIN: 6.6 g/dL (ref 6.1–8.1)

## 2016-04-07 LAB — CBC WITH DIFFERENTIAL/PLATELET
BASOS ABS: 0 {cells}/uL (ref 0–200)
Basophils Relative: 0 %
EOS ABS: 0 {cells}/uL — AB (ref 15–500)
EOS PCT: 0 %
HCT: 38 % (ref 35.0–45.0)
Hemoglobin: 13.1 g/dL (ref 11.7–15.5)
LYMPHS PCT: 11 %
Lymphs Abs: 979 cells/uL (ref 850–3900)
MCH: 31.6 pg (ref 27.0–33.0)
MCHC: 34.5 g/dL (ref 32.0–36.0)
MCV: 91.6 fL (ref 80.0–100.0)
MONOS PCT: 4 %
MPV: 9.7 fL (ref 7.5–12.5)
Monocytes Absolute: 356 cells/uL (ref 200–950)
NEUTROS PCT: 85 %
Neutro Abs: 7565 cells/uL (ref 1500–7800)
PLATELETS: 262 10*3/uL (ref 140–400)
RBC: 4.15 MIL/uL (ref 3.80–5.10)
RDW: 14 % (ref 11.0–15.0)
WBC: 8.9 10*3/uL (ref 3.8–10.8)

## 2016-04-07 LAB — LIPID PANEL
CHOLESTEROL: 360 mg/dL — AB (ref 125–200)
HDL: 60 mg/dL (ref >=46)
LDL Cholesterol: 282 mg/dL — ABNORMAL HIGH (ref <130)
Total CHOL/HDL Ratio: 6 Ratio — ABNORMAL HIGH (ref <=5.0)
Triglycerides: 90 mg/dL (ref <150)
VLDL: 18 mg/dL (ref <30)

## 2016-04-07 LAB — TSH: TSH: 0.39 m[IU]/L — AB

## 2016-04-07 LAB — VITAMIN B12: VITAMIN B 12: 380 pg/mL (ref 200–1100)

## 2016-04-07 LAB — HEPATITIS C ANTIBODY: HCV AB: NEGATIVE

## 2016-04-08 ENCOUNTER — Ambulatory Visit (INDEPENDENT_AMBULATORY_CARE_PROVIDER_SITE_OTHER): Payer: BLUE CROSS/BLUE SHIELD

## 2016-04-08 ENCOUNTER — Ambulatory Visit (INDEPENDENT_AMBULATORY_CARE_PROVIDER_SITE_OTHER): Payer: BLUE CROSS/BLUE SHIELD | Admitting: Podiatry

## 2016-04-08 DIAGNOSIS — M205X1 Other deformities of toe(s) (acquired), right foot: Secondary | ICD-10-CM

## 2016-04-08 DIAGNOSIS — Z09 Encounter for follow-up examination after completed treatment for conditions other than malignant neoplasm: Secondary | ICD-10-CM

## 2016-04-08 LAB — HEMOGLOBIN A1C
Hgb A1c MFr Bld: 5.3 % (ref <5.7)
MEAN PLASMA GLUCOSE: 105 mg/dL

## 2016-04-08 LAB — VITAMIN D 25 HYDROXY (VIT D DEFICIENCY, FRACTURES): Vit D, 25-Hydroxy: 28 ng/mL — ABNORMAL LOW (ref 30–100)

## 2016-04-08 NOTE — Progress Notes (Signed)
Patient ID: Gail Ross, female   DOB: Aug 05, 1960, 55 y.o.   MRN: [id number]  Subjective: Gail Ross is a 55 y.o. is seen today in office s/p right foot 1st MTPJ implant arthroplasty preformed on 01/21/16 she said that she is doing well she's having no pain and she is very happy the outcome of the surgery that she is black she did it. She is wearing a regular shoe. States she presents wearing a flip-flop without any problems. She is return to work without any issues as well..  Objective: General: No acute distress, AAOx3  DP/PT pulses palpable 2/4, CRT < 3 sec to all digits.  Protective sensation intact. Motor function intact.  Right foot: Incision is well coapted without any evidence of dehiscence and a scar has formed. MPJ range of motion is intact. There is no pain with MPJ range of motion. There is no overlying edema, erythema, increase in warmth. No pain on surgical site. No other open lesions or pre-ulcerative lesions.  No pain with calf compression, swelling, warmth, erythema.   Assessment and Plan:  Status post right foot surgery  -Treatment options discussed including all alternatives, risks, and complications -X-rays obtained today. No evidence of acute fracture. Implant intact. -At this time she is doing well from surgery and she is return to work and wearing a regular shoe which is having no pain. With discharge her from the postoperative care. Discussed that if she has any reoccurrence of symptoms or any other issues to call the office and she agrees to this plan.  Celesta Gentile, DPM

## 2016-04-09 ENCOUNTER — Other Ambulatory Visit: Payer: Self-pay | Admitting: Family Medicine

## 2016-04-09 MED ORDER — ROSUVASTATIN CALCIUM 40 MG PO TABS: 40.0000 mg | ORAL_TABLET | Freq: Every day | ORAL | 0 refills | Status: DC

## 2016-04-13 ENCOUNTER — Other Ambulatory Visit: Payer: Self-pay | Admitting: Family Medicine

## 2016-04-13 MED ORDER — LIRAGLUTIDE 18 MG/3ML ~~LOC~~ SOPN: 1.8000 mg | PEN_INJECTOR | Freq: Every day | SUBCUTANEOUS | 0 refills | Status: DC

## 2016-04-15 ENCOUNTER — Other Ambulatory Visit: Payer: Self-pay | Admitting: Family Medicine

## 2016-04-15 ENCOUNTER — Telehealth: Payer: Self-pay | Admitting: Family Medicine

## 2016-04-15 MED ORDER — INSULIN PEN NEEDLE 32G X 4 MM MISC: 1.0000 | Freq: Every day | 2 refills | Status: DC

## 2016-04-15 NOTE — Telephone Encounter (Signed)
Done It should be covered by the voucher

## 2016-04-15 NOTE — Telephone Encounter (Signed)
Requesting the pen needled to go along with her insulin. Please send to [place].

## 2016-04-23 ENCOUNTER — Telehealth: Payer: Self-pay | Admitting: Family Medicine

## 2016-04-23 ENCOUNTER — Other Ambulatory Visit: Payer: Self-pay | Admitting: Family Medicine

## 2016-04-23 NOTE — Telephone Encounter (Signed)
It was sent earlier this week

## 2016-04-23 NOTE — Telephone Encounter (Signed)
Pt needs a call back about pen needles that she is waiting for.

## 2016-04-23 NOTE — Telephone Encounter (Signed)
LMOM to inform pt °

## 2016-04-23 NOTE — Telephone Encounter (Signed)
Patient needs Victoza pen needles, please send a refill.

## 2016-05-03 ENCOUNTER — Other Ambulatory Visit: Payer: Self-pay | Admitting: Family Medicine

## 2016-05-03 DIAGNOSIS — Z1231 Encounter for screening mammogram for malignant neoplasm of breast: Secondary | ICD-10-CM

## 2016-05-05 ENCOUNTER — Ambulatory Visit: Payer: Self-pay

## 2016-05-18 ENCOUNTER — Ambulatory Visit: Payer: BLUE CROSS/BLUE SHIELD | Admitting: Family Medicine

## 2016-05-21 ENCOUNTER — Ambulatory Visit
Admission: RE | Admit: 2016-05-21 | Discharge: 2016-05-21 | Disposition: A | Discharge status: Home / Self Care | Payer: BLUE CROSS/BLUE SHIELD | Source: Ambulatory Visit | Attending: Family Medicine | Admitting: Family Medicine

## 2016-05-21 DIAGNOSIS — Z1231 Encounter for screening mammogram for malignant neoplasm of breast: Secondary | ICD-10-CM | POA: Diagnosis not present

## 2016-06-02 ENCOUNTER — Ambulatory Visit: Payer: BLUE CROSS/BLUE SHIELD | Admitting: Family Medicine

## 2016-06-02 ENCOUNTER — Other Ambulatory Visit: Payer: Self-pay | Admitting: Family Medicine

## 2016-06-02 DIAGNOSIS — G43009 Migraine without aura, not intractable, without status migrainosus: Secondary | ICD-10-CM

## 2016-06-02 NOTE — Telephone Encounter (Signed)
Patient requesting refill of Sumatriptan to [facility].

## 2016-06-09 ENCOUNTER — Other Ambulatory Visit: Payer: Self-pay | Admitting: Family Medicine

## 2016-06-09 NOTE — Telephone Encounter (Signed)
Patient requesting refill of Victoza to [facility].

## 2016-06-30 ENCOUNTER — Ambulatory Visit: Payer: BLUE CROSS/BLUE SHIELD | Admitting: Family Medicine

## 2016-07-28 ENCOUNTER — Ambulatory Visit: Payer: BLUE CROSS/BLUE SHIELD | Admitting: Family Medicine

## 2016-08-18 ENCOUNTER — Ambulatory Visit (INDEPENDENT_AMBULATORY_CARE_PROVIDER_SITE_OTHER): Payer: BLUE CROSS/BLUE SHIELD | Admitting: Family Medicine

## 2016-08-18 ENCOUNTER — Encounter: Payer: Self-pay | Admitting: Family Medicine

## 2016-08-18 VITALS — BP 122/72 | HR 84 | Temp 98.6°F | Resp 16 | Ht 64.0 in | Wt 172.3 lb

## 2016-08-18 DIAGNOSIS — J4540 Moderate persistent asthma, uncomplicated: Secondary | ICD-10-CM

## 2016-08-18 DIAGNOSIS — M791 Myalgia, unspecified site: Secondary | ICD-10-CM

## 2016-08-18 DIAGNOSIS — G43009 Migraine without aura, not intractable, without status migrainosus: Secondary | ICD-10-CM | POA: Diagnosis not present

## 2016-08-18 DIAGNOSIS — E7800 Pure hypercholesterolemia, unspecified: Secondary | ICD-10-CM | POA: Diagnosis not present

## 2016-08-18 DIAGNOSIS — R7989 Other specified abnormal findings of blood chemistry: Secondary | ICD-10-CM

## 2016-08-18 DIAGNOSIS — E663 Overweight: Secondary | ICD-10-CM

## 2016-08-18 DIAGNOSIS — Z79899 Other long term (current) drug therapy: Secondary | ICD-10-CM

## 2016-08-18 DIAGNOSIS — R946 Abnormal results of thyroid function studies: Secondary | ICD-10-CM

## 2016-08-18 DIAGNOSIS — R7303 Prediabetes: Secondary | ICD-10-CM | POA: Insufficient documentation

## 2016-08-18 MED ORDER — ALBUTEROL SULFATE HFA 108 (90 BASE) MCG/ACT IN AERS: 2.0000 | INHALATION_SPRAY | Freq: Four times a day (QID) | RESPIRATORY_TRACT | 0 refills | Status: DC | PRN

## 2016-08-18 MED ORDER — SUMATRIPTAN SUCCINATE 6 MG/0.5ML ~~LOC~~ SOAJ: SUBCUTANEOUS | 2 refills | Status: DC

## 2016-08-18 MED ORDER — LIRAGLUTIDE 18 MG/3ML ~~LOC~~ SOPN: 1.8000 mg | PEN_INJECTOR | Freq: Every day | SUBCUTANEOUS | 0 refills | Status: DC

## 2016-08-18 MED ORDER — COQ-10 100 MG PO CAPS: 1.0000 | ORAL_CAPSULE | Freq: Every day | ORAL | 0 refills | Status: DC

## 2016-08-18 MED ORDER — FLUTICASONE-SALMETEROL 250-50 MCG/DOSE IN AEPB: 1.0000 | INHALATION_SPRAY | Freq: Two times a day (BID) | RESPIRATORY_TRACT | 2 refills | Status: DC

## 2016-08-18 MED ORDER — ROSUVASTATIN CALCIUM 20 MG PO TABS: 20.0000 mg | ORAL_TABLET | Freq: Every day | ORAL | 1 refills | Status: DC

## 2016-08-18 MED ORDER — MONTELUKAST SODIUM 10 MG PO TABS: 10.0000 mg | ORAL_TABLET | Freq: Every day | ORAL | 1 refills | Status: DC

## 2016-08-18 NOTE — Progress Notes (Signed)
Name: Gail Ross   MRN: [id number]    DOB: [birth year]   Date:08/18/2016       Progress Note  Subjective  Chief Complaint  Chief Complaint  Patient presents with  . Medication Refill    4 month F/U  . Allergic Rhinitis     Takes daily controls symptoms, has watery eyes with dramatic temperature change  . Obesity    Doing well with medication, has losted 8 pounds since last visit.  Marland Kitchen Hyperlipidemia    Muscle cramps and weakness in legs  . Asthma    Needs refill of Advair-has a recent attack    HPI  Asthma Moderate: she used to see Dr. Tami Ribas, currently on  Advair and Ventolin prn. Doing well at this time, no SOB or cough, she has occasional wheezing.  Migraine : she had two episodes of migraines since last visit,  episodes are described as throbbing, sharp, severe, associated with photophobia, nausea and sometimes vomiting. She could not tolerate topamax and does not want to try anything else for prevention.  She prefers injectable Imitrex, because of quicker onset of action - works 15 minutes  Obesity: she has gained 18 lbs before last visit, she responded Belviq but it was too expensive, she has prediabetes and is doing well on Victoza, only side effects is mild constipation, she has lost 8 lbs since last visit.  She eats breakfast daily. She was eating fast food over the holiday, but is doing better now.   Hyperlipidemia: not on medication , needs to have labs done  Hyperlipidemia: she has been taking Crestor 40 mg since 03/2016 , but has noticed a lot muscle pain, spasms, we will try changing to lower dose and adding Co-Q 10   Patient Active Problem List   Diagnosis Date Noted  . Pre-diabetes 08/18/2016  . Hallux limitus 12/21/2015  . Migraine without aura, not intractable 12/23/2014  . Mild intermittent asthma 12/23/2014  . Overweight 12/23/2014  . Controlled insomnia 12/23/2014  . Hyperlipidemia 12/23/2014  . Allergic rhinitis 12/23/2014  . History of shingles  12/23/2014  . History of postmenopausal bleeding 12/23/2014  . 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 BREAST BIOPSY Left   . CERVICAL POLYPECTOMY  07/26/12  . halux implant arthro Right 01/21/2016   Dr. Jacqualyn Posey  . TUBAL LIGATION      Family History  Problem Relation Age of Onset  . Hypertension Mother   . Heart attack Father     Social History   Social History  . Marital status: Married    Spouse name: N/A  . Number of children: N/A  . Years of education: N/A   Occupational History  . Not on file.   Social History Main Topics  . Smoking status: Never Smoker  . Smokeless tobacco: Never Used  . Alcohol use 0.0 oz/week     Comment: occasionally  . Drug use: No  . Sexual activity: Not Currently   Other Topics Concern  . Not on file   Social History Narrative  . No narrative on file     Current Outpatient Prescriptions:  .  albuterol (PROAIR HFA) 108 (90 Base) MCG/ACT inhaler, Inhale 2 puffs into the lungs every 6 (six) hours as needed for wheezing or shortness of breath., Disp: 1 Inhaler, Rfl: 0 .  cetirizine (ZYRTEC) 10 MG tablet, Take 10 mg by mouth daily., Disp: , Rfl:  .  fluticasone (FLONASE) 50 MCG/ACT nasal  spray, , Disp: , Rfl:  .  Fluticasone-Salmeterol (ADVAIR) 250-50 MCG/DOSE AEPB, Inhale 1 puff into the lungs 2 (two) times daily., Disp: 60 each, Rfl: 2 .  Insulin Pen Needle (NOVOFINE PLUS) 32G X 4 MM MISC, 1 each by Does not apply route daily., Disp: 100 each, Rfl: 2 .  liraglutide (VICTOZA) 18 MG/3ML SOPN, Inject 0.3 mLs (1.8 mg total) into the skin daily. Start 0.6 mg first week, 1.2 second week and 1.8 after that, Disp: 9 mL, Rfl: 0 .  montelukast (SINGULAIR) 10 MG tablet, Take 1 tablet (10 mg total) by mouth at bedtime., Disp: 90 tablet, Rfl: 1 .  rosuvastatin (CRESTOR) 20 MG tablet, Take 1 tablet (20 mg total) by mouth daily., Disp: 90 tablet, Rfl: 1 .  SUMAtriptan 6 MG/0.5ML SOAJ, inject 1 dose  subcutaneously for migraines *MAX DOSE OF 1 PER 24 HOURS, Disp: 5 mL, Rfl: 2 .  valACYclovir (VALTREX) 1000 MG tablet, Take 1,000 mg by mouth. Eye doctor, Disp: , Rfl:  .  Coenzyme Q10 (COQ-10) 100 MG CAPS, Take 1 capsule by mouth daily., Disp: 30 each, Rfl: 0  Allergies  Allergen Reactions  . Penicillins Anaphylaxis and Hives  . Tree Extract Other (See Comments)    Sneezing      ROS  Constitutional: Negative for fever, positive for  weight change.  Respiratory: Negative for cough and shortness of breath.   Cardiovascular: Negative for chest pain or palpitations.  Gastrointestinal: Negative for abdominal pain, no bowel changes.  Musculoskeletal: Negative for gait problem or joint swelling.  Skin: Negative for rash.  Neurological: Negative for dizziness, positive for intermittent  headache.  No other specific complaints in a complete review of systems (except as listed in HPI above).  Objective  Vitals:   08/18/16 0911  BP: 122/72  Pulse: 84  Resp: 16  Temp: 98.6 F (37 C)  TempSrc: Oral  SpO2: 96%  Weight: 172 lb 4.8 oz (78.2 kg)  Height: 5\' 4"  (1.626 m)    Body mass index is 29.58 kg/m.  Physical Exam  Constitutional: Patient appears well-developed and well-nourished. Obese  No distress.  HEENT: head atraumatic, normocephalic, pupils equal and reactive to light, neck supple, throat within normal limits Cardiovascular: Normal rate, regular rhythm and normal heart sounds.  No murmur heard. No BLE edema. Pulmonary/Chest: Effort normal and breath sounds normal. No respiratory distress. Abdominal: Soft.  There is no tenderness. Psychiatric: Patient has a normal mood and affect. behavior is normal. Judgment and thought content normal.  PHQ2/9: Depression screen [facility] 2/9 08/18/2016 04/05/2016 12/23/2014  Decreased Interest 0 0 0  Down, Depressed, Hopeless 0 0 0  PHQ - 2 Score 0 0 0     Fall Risk: Fall Risk  08/18/2016 04/05/2016 12/23/2014  Falls in the past year? No No  No     Functional Status Survey: Is the patient deaf or have difficulty hearing?: No Does the patient have difficulty seeing, even when wearing glasses/contacts?: No Does the patient have difficulty concentrating, remembering, or making decisions?: No Does the patient have difficulty walking or climbing stairs?: No Does the patient have difficulty dressing or bathing?: No Does the patient have difficulty doing errands alone such as visiting a doctor's office or shopping?: No    Assessment & Plan  1. Pure hypercholesterolemia  - rosuvastatin (CRESTOR) 20 MG tablet; Take 1 tablet (20 mg total) by mouth daily.  Dispense: 90 tablet; Refill: 1 - Coenzyme Q10 (COQ-10) 100 MG CAPS; Take 1 capsule by mouth  daily.  Dispense: 30 each; Refill: 0 - Lipid panel  2. Migraine without aura and without status migrainosus, not intractable  - SUMAtriptan 6 MG/0.5ML SOAJ; inject 1 dose subcutaneously for migraines *MAX DOSE OF 1 PER 24 HOURS  Dispense: 5 mL; Refill: 2  3. Pre-diabetes  She is doing well on Victoza - liraglutide (VICTOZA) 18 MG/3ML SOPN; Inject 0.3 mLs (1.8 mg total) into the skin daily. Start 0.6 mg first week, 1.2 second week and 1.8 after that  Dispense: 9 mL; Refill: 0 - Insulin, fasting - Hemoglobin A1c  4. Myalgia  We will go down from Crestor 40 mg to 20 mg and add Co-Q 10   5. Overweight (BMI 25.0-29.9)  - liraglutide (VICTOZA) 18 MG/3ML SOPN; Inject 0.3 mLs (1.8 mg total) into the skin daily. Start 0.6 mg first week, 1.2 second week and 1.8 after that  Dispense: 9 mL; Refill: 0  6. Moderate persistent asthma without complication  - montelukast (SINGULAIR) 10 MG tablet; Take 1 tablet (10 mg total) by mouth at bedtime.  Dispense: 90 tablet; Refill: 1 - Fluticasone-Salmeterol (ADVAIR) 250-50 MCG/DOSE AEPB; Inhale 1 puff into the lungs 2 (two) times daily.  Dispense: 60 each; Refill: 2 - albuterol (PROAIR HFA) 108 (90 Base) MCG/ACT inhaler; Inhale 2 puffs into the lungs  every 6 (six) hours as needed for wheezing or shortness of breath.  Dispense: 1 Inhaler; Refill: 0  7. Abnormal TSH  - Thyroid Panel With TSH  8. Long-term use of high-risk medication  - COMPLETE METABOLIC PANEL WITH GFR

## 2016-09-06 ENCOUNTER — Other Ambulatory Visit: Payer: Self-pay

## 2016-09-06 DIAGNOSIS — R7303 Prediabetes: Secondary | ICD-10-CM

## 2016-09-06 DIAGNOSIS — E663 Overweight: Secondary | ICD-10-CM

## 2016-09-06 MED ORDER — LIRAGLUTIDE 18 MG/3ML ~~LOC~~ SOPN: 1.8000 mg | PEN_INJECTOR | Freq: Every day | SUBCUTANEOUS | 0 refills | Status: DC

## 2016-09-06 NOTE — Telephone Encounter (Signed)
Patient requesting refill of Victoza to [facility] for 90 day supply.

## 2016-09-10 ENCOUNTER — Other Ambulatory Visit: Payer: Self-pay | Admitting: Family Medicine

## 2016-09-10 DIAGNOSIS — E663 Overweight: Secondary | ICD-10-CM

## 2016-09-10 DIAGNOSIS — R7303 Prediabetes: Secondary | ICD-10-CM

## 2016-09-23 ENCOUNTER — Other Ambulatory Visit: Payer: Self-pay

## 2016-09-23 DIAGNOSIS — R7303 Prediabetes: Secondary | ICD-10-CM

## 2016-09-23 DIAGNOSIS — E663 Overweight: Secondary | ICD-10-CM

## 2016-09-23 MED ORDER — LIRAGLUTIDE 18 MG/3ML ~~LOC~~ SOPN: 1.8000 mg | PEN_INJECTOR | Freq: Every day | SUBCUTANEOUS | 0 refills | Status: DC

## 2016-09-23 NOTE — Telephone Encounter (Signed)
Patient said Dr. Ancil Boozer discussing changing to Ozempic, but either way is fine with her. She is doing fine on Victoza but when sending it in, it has to be a 90 day supply due to cost.

## 2016-11-17 ENCOUNTER — Ambulatory Visit: Payer: BLUE CROSS/BLUE SHIELD | Admitting: Family Medicine

## 2016-12-21 ENCOUNTER — Telehealth: Payer: Self-pay | Admitting: Family Medicine

## 2016-12-21 DIAGNOSIS — R7303 Prediabetes: Secondary | ICD-10-CM

## 2016-12-21 DIAGNOSIS — E663 Overweight: Secondary | ICD-10-CM

## 2016-12-21 NOTE — Telephone Encounter (Signed)
Patient requesting refill of Victoza to [facility].

## 2016-12-22 NOTE — Telephone Encounter (Signed)
Lvm for stating script has been sent to pharmacy but to give our office a call to schedule appointment.

## 2017-01-15 ENCOUNTER — Other Ambulatory Visit: Payer: Self-pay | Admitting: Family Medicine

## 2017-01-15 DIAGNOSIS — R7303 Prediabetes: Secondary | ICD-10-CM

## 2017-01-15 DIAGNOSIS — E663 Overweight: Secondary | ICD-10-CM

## 2017-01-17 NOTE — Telephone Encounter (Signed)
Left voice message for pt to return call. Dr Ancil Boozer would like for her to be seen possible this Thursday. Dr Ancil Boozer is also willing to give her samples of victoza until she is able to get in to be seen.

## 2017-04-12 ENCOUNTER — Other Ambulatory Visit: Payer: Self-pay | Admitting: Family Medicine

## 2017-04-12 DIAGNOSIS — Z1211 Encounter for screening for malignant neoplasm of colon: Secondary | ICD-10-CM

## 2017-04-12 DIAGNOSIS — G43009 Migraine without aura, not intractable, without status migrainosus: Secondary | ICD-10-CM

## 2017-04-12 NOTE — Telephone Encounter (Signed)
Patient is requesting refills for her Sumatriptan.   Last visit: 08/18/2016.

## 2017-04-12 NOTE — Telephone Encounter (Signed)
She needs migraine follow up, last visit 08/2016 Cologuard ordered, but needs to be released

## 2017-04-12 NOTE — Telephone Encounter (Signed)
Patient would like to proceed with doing the Cologuard. A new order has been teed up to sign. Patient will print off cologuard forms sign her portion and bring it up her to have you sign and we will fax for her.

## 2017-04-14 ENCOUNTER — Ambulatory Visit (INDEPENDENT_AMBULATORY_CARE_PROVIDER_SITE_OTHER): Payer: 59 | Admitting: Family Medicine

## 2017-04-14 ENCOUNTER — Encounter: Payer: Self-pay | Admitting: Family Medicine

## 2017-04-14 VITALS — BP 130/90 | HR 90 | Ht 64.0 in | Wt 187.9 lb

## 2017-04-14 DIAGNOSIS — G43009 Migraine without aura, not intractable, without status migrainosus: Secondary | ICD-10-CM

## 2017-04-14 DIAGNOSIS — R7303 Prediabetes: Secondary | ICD-10-CM

## 2017-04-14 DIAGNOSIS — E7800 Pure hypercholesterolemia, unspecified: Secondary | ICD-10-CM

## 2017-04-14 DIAGNOSIS — Z23 Encounter for immunization: Secondary | ICD-10-CM

## 2017-04-14 DIAGNOSIS — J4540 Moderate persistent asthma, uncomplicated: Secondary | ICD-10-CM

## 2017-04-14 DIAGNOSIS — E669 Obesity, unspecified: Secondary | ICD-10-CM

## 2017-04-14 LAB — POCT GLYCOSYLATED HEMOGLOBIN (HGB A1C): Hemoglobin A1C: 5.9

## 2017-04-14 MED ORDER — ALBUTEROL SULFATE HFA 108 (90 BASE) MCG/ACT IN AERS: 2.0000 | INHALATION_SPRAY | Freq: Four times a day (QID) | RESPIRATORY_TRACT | 0 refills | Status: DC | PRN

## 2017-04-14 MED ORDER — ROSUVASTATIN CALCIUM 20 MG PO TABS: 20.0000 mg | ORAL_TABLET | Freq: Every day | ORAL | 1 refills | Status: DC

## 2017-04-14 MED ORDER — SPACER/AERO CHAMBER MOUTHPIECE MISC: 1.0000 | Freq: Every day | 0 refills | Status: DC

## 2017-04-14 MED ORDER — SUMATRIPTAN SUCCINATE 6 MG/0.5ML ~~LOC~~ SOAJ: SUBCUTANEOUS | 2 refills | Status: DC

## 2017-04-14 MED ORDER — NALTREXONE-BUPROPION HCL ER 8-90 MG PO TB12: 2.0000 | ORAL_TABLET | Freq: Two times a day (BID) | ORAL | 2 refills | Status: DC

## 2017-04-14 MED ORDER — FLUTICASONE FUROATE-VILANTEROL 100-25 MCG/INH IN AEPB: 1.0000 | INHALATION_SPRAY | Freq: Every day | RESPIRATORY_TRACT | 1 refills | Status: DC

## 2017-04-14 MED ORDER — LORCASERIN HCL ER 20 MG PO TB24: 1.0000 | ORAL_TABLET | Freq: Every day | ORAL | 2 refills | Status: DC

## 2017-04-14 NOTE — Progress Notes (Signed)
Name: Gail Ross   MRN: [id number]    DOB: 14-Apr-1961   Date:04/14/2017       Progress Note  Subjective  Chief Complaint  Chief Complaint  Patient presents with  . Medication Refill    HPI  Asthma Moderate: she used to see Dr. Tami Ribas, she was without insurance for months and has been out of Advair, she had to go to [facility] with sob, wheezing and dry cough, diagnosed with asthma flare and given prednisone taper and advised to follow up with me. Finished taper 4 days ago and has been without any other medication. Still has some symptoms, such as sob with activity, no wheezing, still has mild cough. She moved to a new area [facility] in July), allergies has been worse since she moved. We will try Breo in place of Advair.   Migraine : episodes a little more frequent about 2 episodes per month  episodes are described as throbbing, sharp, severe, associated with photophobia, nausea and sometimes vomiting. She could not tolerate topamax and does not want to try anything else for prevention. She prefers injectable Imitrex, because of quicker onset of action - works 15 minutes  Obesity: she has tried Marketing executive but not covered by insurance, tried Victoza but it caused indigestion. She has pre-diabetes and hgbA1C is up, she wold like to try Belviq again,  she is willing to try Ozempic at lower dose if Belviq is denied. She denies family history of thyroid cancer or personal history of pancreatitis.   Hyperlipidemia: she has was taking Crestor 40 mg since 03/2016 , but has noticed a lot muscle pain, spasms, we changed to 20 mg and added Co-Q 10 however over the past 3 months she has been out of medication   Patient Active Problem List   Diagnosis Date Noted  . Pre-diabetes 08/18/2016  . Hallux limitus 12/21/2015  . Migraine without aura, not intractable 12/23/2014  . Mild intermittent asthma 12/23/2014  . Overweight 12/23/2014  . Controlled insomnia 12/23/2014  . Hyperlipidemia 12/23/2014  .  Allergic rhinitis 12/23/2014  . History of shingles 12/23/2014  . History of postmenopausal bleeding 12/23/2014  . 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 BREAST BIOPSY Left   . CERVICAL POLYPECTOMY  07/26/12  . halux implant arthro Right 01/21/2016   Dr. Jacqualyn Posey  . TUBAL LIGATION      Family History  Problem Relation Age of Onset  . Hypertension Mother   . Heart attack Father     Social History   Social History  . Marital status: Married    Spouse name: N/A  . Number of children: N/A  . Years of education: N/A   Occupational History  . sales     [employer]   Social History Main Topics  . Smoking status: Never Smoker  . Smokeless tobacco: Never Used  . Alcohol use 0.0 oz/week     Comment: occasionally  . Drug use: No  . Sexual activity: Not Currently   Other Topics Concern  . 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    Lives alone.    Married but not together- never got a divorce     Current Outpatient Prescriptions:  .  albuterol (PROAIR HFA) 108 (90 Base) MCG/ACT inhaler, Inhale 2 puffs into the lungs every 6 (six) hours as needed for wheezing or shortness of breath., Disp: 1  Inhaler, Rfl: 0 .  cetirizine (ZYRTEC) 10 MG tablet, Take 10 mg by mouth daily., Disp: , Rfl:  .  Coenzyme Q10 (COQ-10) 100 MG CAPS, Take 1 capsule by mouth daily., Disp: 30 each, Rfl: 0 .  fluticasone (FLONASE) 50 MCG/ACT nasal spray, , Disp: , Rfl:  .  montelukast (SINGULAIR) 10 MG tablet, Take 1 tablet (10 mg total) by mouth at bedtime., Disp: 90 tablet, Rfl: 1 .  rosuvastatin (CRESTOR) 20 MG tablet, Take 1 tablet (20 mg total) by mouth daily., Disp: 90 tablet, Rfl: 1 .  SUMAtriptan 6 MG/0.5ML SOAJ, inject 1 dose subcutaneously for migraines *MAX DOSE OF 1 PER 24 HOURS, Disp: 5 mL, Rfl: 2 .  valACYclovir (VALTREX) 1000 MG tablet, Take 1,000 mg by mouth. Eye doctor, Disp: ,  Rfl:   Allergies  Allergen Reactions  . Penicillins Anaphylaxis and Hives  . Tree Extract Other (See Comments)    Sneezing      ROS  Constitutional: Negative for fever, positive for  weight change.  Respiratory: Positive for cough and shortness of breath, recent asthma flare - out of asthma medication.   Cardiovascular: Negative for chest pain or palpitations.  Gastrointestinal: Negative for abdominal pain, no bowel changes.  Musculoskeletal: Negative for gait problem or joint swelling.  Skin: Negative for rash.  Neurological: Negative for dizziness or headache.  No other specific complaints in a complete review of systems (except as listed in HPI above).  Objective  Vitals:   04/14/17 1059  BP: 130/90  Pulse: 90  SpO2: 98%  Weight: 187 lb 14.4 oz (85.2 kg)  Height: 5\' 4"  (1.626 m)    Body mass index is 32.25 kg/m.  Physical Exam  Constitutional: Patient appears well-developed and well-nourished. Obese  No distress.  HEENT: head atraumatic, normocephalic, pupils equal and reactive to light, neck supple, throat within normal limits Cardiovascular: Normal rate, regular rhythm and normal heart sounds.  No murmur heard. No BLE edema. Pulmonary/Chest: Effort normal and breath sounds normal. No respiratory distress. Abdominal: Soft.  There is no tenderness. Psychiatric: Patient has a normal mood and affect. behavior is normal. Judgment and thought content normal.  PHQ2/9: Depression screen [facility] 2/9 08/18/2016 04/05/2016 12/23/2014  Decreased Interest 0 0 0  Down, Depressed, Hopeless 0 0 0  PHQ - 2 Score 0 0 0    Fall Risk: Fall Risk  08/18/2016 04/05/2016 12/23/2014  Falls in the past year? No No No     Assessment & Plan  1. Migraine without aura and without status migrainosus, not intractable  - SUMAtriptan 6 MG/0.5ML SOAJ; inject 1 dose subcutaneously for migraines *MAX DOSE OF 1 PER 24 HOURS  Dispense: 5 mL; Refill: 2  2. Pure hypercholesterolemia  - rosuvastatin  (CRESTOR) 20 MG tablet; Take 1 tablet (20 mg total) by mouth daily.  Dispense: 90 tablet; Refill: 1 - Lipid panel  3. Flu vaccine need  - Flu Vaccine QUAD 6+ mos PF IM (Fluarix Quad PF)  4. Pre-diabetes  - POCT HgB A1C - Insulin, fasting - COMPLETE METABOLIC PANEL WITH GFR  5. Moderate persistent asthma without complication  - albuterol (PROAIR HFA) 108 (90 Base) MCG/ACT inhaler; Inhale 2 puffs into the lungs every 6 (six) hours as needed for wheezing or shortness of breath.  Dispense: 1 Inhaler; Refill: 0 - fluticasone furoate-vilanterol (BREO ELLIPTA) 100-25 MCG/INH AEPB; Inhale 1 puff into the lungs daily.  Dispense: 180 each; Refill: 1 - Spacer/Aero Chamber Mouthpiece MISC; 1 each by Does not apply route  daily.  Dispense: 1 each; Refill: 0  6. Obesity (BMI 30.0-34.9)   Discussed with the patient the risk posed by an increased BMI. Discussed importance of portion control, calorie counting and at least 150 minutes of physical activity weekly. Avoid sweet beverages and drink more water. Eat at least 6 servings of fruit and vegetables daily  - Lorcaserin HCl ER (BELVIQ XR) 20 MG TB24; Take 1 tablet by mouth daily.  Dispense: 30 tablet; Refill: 2

## 2017-04-18 ENCOUNTER — Telehealth: Payer: Self-pay

## 2017-04-18 MED ORDER — ALBUTEROL SULFATE HFA 108 (90 BASE) MCG/ACT IN AERS: 2.0000 | INHALATION_SPRAY | Freq: Four times a day (QID) | RESPIRATORY_TRACT | 0 refills | Status: DC | PRN

## 2017-04-18 NOTE — Telephone Encounter (Signed)
Please change prescription next time. It should have gone to her pharmacy already.  Thank you

## 2017-04-18 NOTE — Telephone Encounter (Signed)
Proair is not covered by [employer] the alternative that is covered is Ventolin HFA. Please call in or send in a new prescription to [facility].. Thanks

## 2017-04-25 ENCOUNTER — Telehealth: Payer: Self-pay

## 2017-04-25 NOTE — Telephone Encounter (Signed)
Patient insurance does not cover any weight loss medication. Patient called her insurance and they told her as well as the PA for Contrave coming back denied. Patient notified and will try a coupon with paying cash and see how much the Belviq will cost her out of pocket. Because the Contrave was $300 but the Belviq was on $150.

## 2017-08-18 ENCOUNTER — Other Ambulatory Visit: Payer: Self-pay | Admitting: Family Medicine

## 2017-08-18 DIAGNOSIS — G43009 Migraine without aura, not intractable, without status migrainosus: Secondary | ICD-10-CM

## 2017-08-22 ENCOUNTER — Ambulatory Visit: Payer: 59 | Admitting: Family Medicine

## 2017-08-29 ENCOUNTER — Ambulatory Visit: Payer: 59 | Admitting: Family Medicine

## 2017-09-13 ENCOUNTER — Other Ambulatory Visit: Payer: Self-pay | Admitting: Family Medicine

## 2017-09-13 DIAGNOSIS — G43009 Migraine without aura, not intractable, without status migrainosus: Secondary | ICD-10-CM

## 2017-09-13 NOTE — Telephone Encounter (Signed)
Copied from [place] [phone]. Topic: Quick Communication - Rx Refill/Question >> Sep 13, 2017  4:07 PM Robina Ade, Helene Kelp D wrote: Medication: SUMAtriptan 6 MG/0.5ML SOAJ   Has the patient contacted their pharmacy? Yes and patient would like enough to last her until her appt on 10/10/17.   (Agent: If no, request that the patient contact the pharmacy for the refill.)   Preferred Pharmacy (with phone number or street name): [facility] - [place], [address]   Agent: Please be advised that RX refills may take up to 3 business days. We ask that you follow-up with your pharmacy.

## 2017-09-14 MED ORDER — SUMATRIPTAN SUCCINATE 6 MG/0.5ML ~~LOC~~ SOAJ: SUBCUTANEOUS | 2 refills | Status: DC

## 2017-09-14 NOTE — Telephone Encounter (Signed)
Refill request for general medication: Sumatriptan  SOAJ  Last office visit: 04/14/2017  Follow-up on file. 10/10/2017

## 2017-10-10 ENCOUNTER — Encounter: Payer: Self-pay | Admitting: Family Medicine

## 2017-10-10 ENCOUNTER — Ambulatory Visit: Payer: 59 | Admitting: Family Medicine

## 2017-10-10 VITALS — BP 120/80 | HR 67 | Resp 16 | Ht 67.0 in | Wt 194.9 lb

## 2017-10-10 DIAGNOSIS — Z79899 Other long term (current) drug therapy: Secondary | ICD-10-CM

## 2017-10-10 DIAGNOSIS — H6992 Unspecified Eustachian tube disorder, left ear: Secondary | ICD-10-CM | POA: Diagnosis not present

## 2017-10-10 DIAGNOSIS — G43009 Migraine without aura, not intractable, without status migrainosus: Secondary | ICD-10-CM | POA: Diagnosis not present

## 2017-10-10 DIAGNOSIS — E66811 Obesity, class 1: Secondary | ICD-10-CM

## 2017-10-10 DIAGNOSIS — J4540 Moderate persistent asthma, uncomplicated: Secondary | ICD-10-CM | POA: Diagnosis not present

## 2017-10-10 DIAGNOSIS — E7849 Other hyperlipidemia: Secondary | ICD-10-CM | POA: Diagnosis not present

## 2017-10-10 DIAGNOSIS — E669 Obesity, unspecified: Secondary | ICD-10-CM | POA: Diagnosis not present

## 2017-10-10 DIAGNOSIS — R7303 Prediabetes: Secondary | ICD-10-CM

## 2017-10-10 DIAGNOSIS — M791 Myalgia, unspecified site: Secondary | ICD-10-CM | POA: Diagnosis not present

## 2017-10-10 LAB — POCT GLYCOSYLATED HEMOGLOBIN (HGB A1C): Hemoglobin A1C: 5.6

## 2017-10-10 MED ORDER — EVOLOCUMAB 140 MG/ML ~~LOC~~ SOSY: 1.0000 mL | PREFILLED_SYRINGE | SUBCUTANEOUS | 5 refills | Status: DC

## 2017-10-10 MED ORDER — LORCASERIN HCL ER 20 MG PO TB24: 1.0000 | ORAL_TABLET | Freq: Every day | ORAL | 2 refills | Status: DC

## 2017-10-10 MED ORDER — PREDNISONE 10 MG PO TABS: 10.0000 mg | ORAL_TABLET | Freq: Two times a day (BID) | ORAL | 0 refills | Status: DC

## 2017-10-10 MED ORDER — ALBUTEROL SULFATE HFA 108 (90 BASE) MCG/ACT IN AERS: 2.0000 | INHALATION_SPRAY | Freq: Four times a day (QID) | RESPIRATORY_TRACT | 0 refills | Status: DC | PRN

## 2017-10-10 MED ORDER — FLUTICASONE FUROATE-VILANTEROL 100-25 MCG/INH IN AEPB: 1.0000 | INHALATION_SPRAY | Freq: Every day | RESPIRATORY_TRACT | 1 refills | Status: DC

## 2017-10-10 MED ORDER — MONTELUKAST SODIUM 10 MG PO TABS: 10.0000 mg | ORAL_TABLET | Freq: Every day | ORAL | 1 refills | Status: DC

## 2017-10-10 NOTE — Progress Notes (Signed)
Name: Gail Ross   MRN: [id number]    DOB: 02-Oct-1960   Date:10/11/2017       Progress Note  Subjective  Chief Complaint  Chief Complaint  Patient presents with  . Follow-up    HPI  Asthma Moderate: she used to see Dr. Tami Ribas, she had a gap in insurance and had a severe last Fall, however she has been on Breo daily and no wheezing, denies dry cough or SOB. She has not used rescue inhaler in months.   Migraine : episodes are much more frequent lately, up from 2 episodes per month to 6-7 and last week one of the episodes lasted 4 days. They are described as throbbing, sharp, severe, associated with photophobia, nausea and sometimes vomiting. She could not tolerate topamax and does not want to try anything else for prevention. She prefers injectable Imitrex, because of quicker onset of action - works 15 minutes. She states migraine seems to be triggered by stress.   Obesity: she has tried Belviq but not covered by insurance, tried Victoza but it caused indigestion. She has pre-diabetes and hgbA1C is up, she wold like to try Belviq again. She is eating more because of stress. We will try rx, also reviewed dietary modification   Hyperlipidemia: she was taking Crestor 40 mg since 03/2016 , but has noticed a lot muscle pain, spasms, we changed to 20 mg and added Co-Q 10 however leg pain got worse and she had to stop medication again a couple of months ago. Family history significant for heart disease, has one paternal uncle and father that died from heart disease in their 26's   Eustachian tube dysfunction: she has been using nasal spray, states drives from [place] to [place] daily for work, and has noticed that left ear has not been normal over the past few weeks, using nasal spray, but not controlling, responded to prednisone by mouth in the past and would like a refill   Patient Active Problem List   Diagnosis Date Noted  . Familial hyperlipidemia 10/10/2017  . Pre-diabetes 08/18/2016   . Obesity 04/05/2016  . Hallux limitus 12/21/2015  . Migraine without aura, not intractable 12/23/2014  . Mild intermittent asthma 12/23/2014  . Controlled insomnia 12/23/2014  . Hyperlipidemia 12/23/2014  . Allergic rhinitis 12/23/2014  . History of shingles 12/23/2014  . History of postmenopausal bleeding 12/23/2014  . 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 BREAST BIOPSY Left   . CERVICAL POLYPECTOMY  07/26/12  . halux implant arthro Right 01/21/2016   Dr. Jacqualyn Posey  . TUBAL LIGATION      Family History  Problem Relation Age of Onset  . Hypertension Mother   . Heart attack Father   . Heart attack Paternal Uncle   . Heart attack Paternal Grandfather     Social History   Socioeconomic History  . Marital status: Divorced    Spouse name: Not on file  . Number of children: 0  . Years of education: Not on file  . Highest education level: Some college, no degree  Occupational History  . 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 Financial resource strain: Not on file  . Food insecurity:    Worry: Not on file    Inability: Not on file  . Transportation needs:    Medical: Not on file    Non-medical: Not on file  Tobacco Use  . Smoking status: Never Smoker  .  Smokeless tobacco: Never Used  Substance and Sexual Activity  . Alcohol use: Yes    Alcohol/week: 0.0 oz    Comment: occasionally  . Drug use: No  . Sexual activity: Not Currently  Lifestyle  . Physical activity:    Days per week: Not on file    Minutes per session: Not on file  . Stress: Not on file  Relationships  . Social connections:    Talks on phone: Not on file    Gets together: Not on file    Attends religious service: Not on file    Active member of club or organization: Not on file    Attends meetings of clubs or organizations: Not on file    Relationship status: Not on file  . Intimate partner violence:    Fear of  current or ex partner: Not on file    Emotionally abused: Not on file    Physically abused: Not on file    Forced sexual activity: Not on file  Other Topics Concern  . Not on file  Social History Narrative   Moved to [place] because of new job, but has to commute to [employer].    Lives alone.    Married but not together- never got a divorce     Current Outpatient Medications:  .  albuterol (VENTOLIN HFA) 108 (90 Base) MCG/ACT inhaler, Inhale 2 puffs into the lungs every 6 (six) hours as needed for wheezing or shortness of breath., Disp: 1 each, Rfl: 0 .  cetirizine (ZYRTEC) 10 MG tablet, Take 10 mg by mouth daily., Disp: , Rfl:  .  Evolocumab (REPATHA) 140 MG/ML SOSY, Inject 1 mL into the skin every 14 (fourteen) days., Disp: 2 mL, Rfl: 5 .  fluticasone (FLONASE) 50 MCG/ACT nasal spray, , Disp: , Rfl:  .  fluticasone furoate-vilanterol (BREO ELLIPTA) 100-25 MCG/INH AEPB, Inhale 1 puff into the lungs daily., Disp: 180 each, Rfl: 1 .  Lorcaserin HCl ER (BELVIQ XR) 20 MG TB24, Take 1 tablet by mouth daily., Disp: 30 tablet, Rfl: 2 .  montelukast (SINGULAIR) 10 MG tablet, Take 1 tablet (10 mg total) by mouth at bedtime., Disp: 90 tablet, Rfl: 1 .  predniSONE (DELTASONE) 10 MG tablet, Take 1 tablet (10 mg total) by mouth 2 (two) times daily with a meal., Disp: 10 tablet, Rfl: 0 .  SUMAtriptan 6 MG/0.5ML SOAJ, inject 1 dose subcutaneously for migraines *MAX DOSE OF 1 PER 24 HOURS, Disp: 5 mL, Rfl: 2 .  valACYclovir (VALTREX) 1000 MG tablet, Take 1,000 mg by mouth. Eye doctor, Disp: , Rfl:   Allergies  Allergen Reactions  . Penicillins Anaphylaxis and Hives  . Topamax [Topiramate]   . Tree Extract Other (See Comments)    Sneezing   . Victoza [Liraglutide]     Indigestion and worsening of migraines     ROS  Constitutional: Negative for fever, positive for  weight change.  Respiratory: Negative for cough and shortness of breath.   Cardiovascular: Negative for chest pain  or palpitations.  Gastrointestinal: Negative for abdominal pain, no bowel changes.  Musculoskeletal: Negative for gait problem or joint swelling.  Skin: Negative for rash.  Neurological: Negative for dizziness or headache.  No other specific complaints in a complete review of systems (except as listed in HPI above).  Objective   Vitals:   10/10/17 1433  BP: 120/80  Pulse: 67  Resp: 16  SpO2: 98%  Weight: 194 lb 14.4 oz (88.4 kg)  Height: 5'  7" (1.702 m)    Body mass index is 30.53 kg/m.  Physical Exam  Constitutional: Patient appears well-developed and well-nourished. Obese No distress.  HEENT: head atraumatic, normocephalic, pupils equal and reactive to light, ears TM normal bilaterally, neck supple, throat within normal limits Cardiovascular: Normal rate, regular rhythm and normal heart sounds.  No murmur heard. No BLE edema. Pulmonary/Chest: Effort normal and breath sounds normal. No respiratory distress. Abdominal: Soft.  There is no tenderness. Psychiatric: Patient has a normal mood and affect. behavior is normal. Judgment and thought content normal. Neurological: no focal findings.   Recent Results (from the past 2160 hour(s))  POCT HgB A1C     Status: Normal   Collection Time: 10/10/17  1:43 PM  Result Value Ref Range   Hemoglobin A1C 5.6   Lipid panel     Status: Abnormal   Collection Time: 10/10/17  2:49 PM  Result Value Ref Range   Cholesterol 320 (H) <200 mg/dL   HDL 49 (L) >50 mg/dL   Triglycerides 162 (H) <150 mg/dL   LDL Cholesterol (Calc) 238 (H) mg/dL (calc)    Comment: LDL-C levels > or = 190 mg/dL may indicate familial  hypercholesterolemia (FH). Clinical assessment and  measurement of blood lipid levels should be  considered for all first degree relatives of  patients with an FH diagnosis.  For questions about testing for familial hypercholesterolemia, please call Insurance risk surveyor at [email or web address]. Duncan Dull, et al. J National  Lipid Association  Recommendations for Patient-Centered Management of  Dyslipidemia: Part 1 Journal of Clinical Lipidology  2015;9(2), 129-169. Reference range: <100 . Desirable range <100 mg/dL for primary prevention;   <70 mg/dL for patients with CHD or diabetic patients  with > or = 2 CHD risk factors. Marland Kitchen LDL-C is now calculated using the Martin-Hopkins  calculation, which is a validated novel method providing  better accuracy than the Friedewald equation in the  estimation of LDL-C.  Cresenciano Genre et al. Annamaria Helling. 0258;527(78): 2061-2068  (http://education.[employer].com/faq/FAQ164)    Total CHOL/HDL Ratio 6.5 (H) <5.0 (calc)   Non-HDL Cholesterol (Calc) 271 (H) <130 mg/dL (calc)    Comment: Non-HDL level > or = 220 is very high and may indicate  genetic familial hypercholesterolemia (FH). Clinical  assessment and measurement of blood lipid levels  should be considered for all first-degree relatives  of patients with an FH diagnosis. . For patients with diabetes plus 1 major ASCVD risk  factor, treating to a non-HDL-C goal of <100 mg/dL  (LDL-C of <70 mg/dL) is considered a therapeutic  option.   COMPLETE METABOLIC PANEL WITH GFR     Status: None   Collection Time: 10/10/17  2:49 PM  Result Value Ref Range   Glucose, Bld 84 65 - 99 mg/dL    Comment: .            Fasting reference interval .    BUN 16 7 - 25 mg/dL   Creat 0.90 0.50 - 1.05 mg/dL    Comment: For patients >37 years of age, the reference limit for Creatinine is approximately 13% higher for people identified as African-American. .    GFR, Est Non African American 70 > OR = 60 mL/min/1.33m2   GFR, Est African American 81 > OR = 60 mL/min/1.59m2   BUN/Creatinine Ratio NOT APPLICABLE 6 - 22 (calc)   Sodium 141 135 - 146 mmol/L   Potassium 4.1 3.5 - 5.3 mmol/L   Chloride 106 98 - 110 mmol/L   CO2  25 20 - 32 mmol/L   Calcium 9.6 8.6 - 10.4 mg/dL   Total Protein 6.9 6.1 - 8.1 g/dL   Albumin 4.6 3.6 - 5.1  g/dL   Globulin 2.3 1.9 - 3.7 g/dL (calc)   AG Ratio 2.0 1.0 - 2.5 (calc)   Total Bilirubin 0.5 0.2 - 1.2 mg/dL   Alkaline phosphatase (APISO) 96 33 - 130 U/L   AST 24 10 - 35 U/L   ALT 28 6 - 29 U/L  CBC with Differential/Platelet     Status: None   Collection Time: 10/10/17  2:49 PM  Result Value Ref Range   WBC 7.0 3.8 - 10.8 Thousand/uL   RBC 4.53 3.80 - 5.10 Million/uL   Hemoglobin 14.5 11.7 - 15.5 g/dL   HCT 41.0 35.0 - 45.0 %   MCV 90.5 80.0 - 100.0 fL   MCH 32.0 27.0 - 33.0 pg   MCHC 35.4 32.0 - 36.0 g/dL   RDW 12.8 11.0 - 15.0 %   Platelets 256 140 - 400 Thousand/uL   MPV 10.7 7.5 - 12.5 fL   Neutro Abs 4,655 1,500 - 7,800 cells/uL   Lymphs Abs 1,575 850 - 3,900 cells/uL   WBC mixed population 441 200 - 950 cells/uL   Eosinophils Absolute 308 15 - 500 cells/uL   Basophils Absolute 21 0 - 200 cells/uL   Neutrophils Relative % 66.5 %   Total Lymphocyte 22.5 %   Monocytes Relative 6.3 %   Eosinophils Relative 4.4 %   Basophils Relative 0.3 %  Hemoglobin A1c     Status: None   Collection Time: 10/10/17  2:49 PM  Result Value Ref Range   Hgb A1c MFr Bld 5.6 <5.7 % of total Hgb    Comment: For the purpose of screening for the presence of diabetes: . <5.7%       Consistent with the absence of diabetes 5.7-6.4%    Consistent with increased risk for diabetes             (prediabetes) > or =6.5%  Consistent with diabetes . This assay result is consistent with a decreased risk of diabetes. . Currently, no consensus exists regarding use of hemoglobin A1c for diagnosis of diabetes in children. . According to American Diabetes Association (ADA) guidelines, hemoglobin A1c <7.0% represents optimal control in non-pregnant diabetic patients. Different metrics may apply to specific patient populations.  Standards of Medical Care in Diabetes(ADA). .    Mean Plasma Glucose 114 (calc)   eAG (mmol/L) 6.3 (calc)  TSH     Status: None   Collection Time: 10/10/17  2:49 PM   Result Value Ref Range   TSH 1.49 0.40 - 4.50 mIU/L     PHQ2/9: Depression screen [facility] 2/9 10/10/2017 08/18/2016 04/05/2016 12/23/2014  Decreased Interest 1 0 0 0  Down, Depressed, Hopeless 1 0 0 0  PHQ - 2 Score 2 0 0 0  Altered sleeping 0 - - -  Tired, decreased energy 1 - - -  Change in appetite 1 - - -  Feeling bad or failure about yourself  0 - - -  Trouble concentrating 0 - - -  Moving slowly or fidgety/restless 0 - - -  Suicidal thoughts 0 - - -  PHQ-9 Score 4 - - -  Difficult doing work/chores Not difficult at all - - -     Fall Risk: Fall Risk  08/18/2016 04/05/2016 12/23/2014  Falls in the past year? No No No  Assessment & Plan  1. Pre-diabetes  - POCT HgB A1C - Hemoglobin A1c  2. Moderate persistent asthma without complication  - albuterol (VENTOLIN HFA) 108 (90 Base) MCG/ACT inhaler; Inhale 2 puffs into the lungs every 6 (six) hours as needed for wheezing or shortness of breath.  Dispense: 1 each; Refill: 0 - fluticasone furoate-vilanterol (BREO ELLIPTA) 100-25 MCG/INH AEPB; Inhale 1 puff into the lungs daily.  Dispense: 180 each; Refill: 1 - montelukast (SINGULAIR) 10 MG tablet; Take 1 tablet (10 mg total) by mouth at bedtime.  Dispense: 90 tablet; Refill: 1  3. Obesity (BMI 30.0-34.9)  Discussed with the patient the risk posed by an increased BMI. Discussed importance of portion control, calorie counting and at least 150 minutes of physical activity weekly. Avoid sweet beverages and drink more water. Eat at least 6 servings of fruit and vegetables daily  - Lorcaserin HCl ER (BELVIQ XR) 20 MG TB24; Take 1 tablet by mouth daily.  Dispense: 30 tablet; Refill: 2 - TSH  4. Migraine without aura and without status migrainosus, not intractable  Cannot tolerate Topamax, she has about 6-7 episodes per month, episodes can last up to 4 days in a row   5. Familial hyperlipidemia  - Lipid panel We will try repatha 140 mg every 2 weeks and recheck labs next visit     6. Long-term use of high-risk medication  - COMPLETE METABOLIC PANEL WITH GFR - CBC with Differential/Platelet  7. Eustachian tube disorder, left  She would like a refill of prednisone and explained that if no resolution she will need to see ENT  8. Myalgia  Cannot tolerate statin therapy, discussed options and we will try PA for PCSK9 .

## 2017-10-10 NOTE — Patient Instructions (Signed)
Evolocumab injection What is this medicine? EVOLOCUMAB (e voe LOK ue mab) is known as a PCSK9 inhibitor. It is used to lower the level of cholesterol in the blood. It may be used alone or in combination with other cholesterol-lowering drugs. This drug may also be used to reduce the risk of heart attack, stroke, and certain types of heart surgery in patients with heart disease. This medicine may be used for other purposes; ask your health care provider or pharmacist if you have questions. COMMON BRAND NAME(S): REPATHA What should I tell my health care provider before I take this medicine? They need to know if you have any of these conditions: -an unusual or allergic reaction to evolocumab, other medicines, foods, dyes, or preservatives -pregnant or trying to get pregnant -breast-feeding How should I use this medicine? This medicine is for injection under the skin. You will be taught how to prepare and give this medicine. Use exactly as directed. Take your medicine at regular intervals. Do not take your medicine more often than directed. It is important that you put your used needles and syringes in a special sharps container. Do not put them in a trash can. If you do not have a sharps container, call your pharmacist or health care provider to get one. Talk to your pediatrician regarding the use of this medicine in children. While this drug may be prescribed for children as young as 13 years for selected conditions, precautions do apply. Overdosage: If you think you have taken too much of this medicine contact a poison control center or emergency room at once. NOTE: This medicine is only for you. Do not share this medicine with others. What if I miss a dose? If you miss a dose, take it as soon as you can if there are more than 7 days until the next scheduled dose, or skip the missed dose and take the next dose according to your original schedule. Do not take double or extra doses. What may interact  with this medicine? Interactions are not expected. This list may not describe all possible interactions. Give your health care provider a list of all the medicines, herbs, non-prescription drugs, or dietary supplements you use. Also tell them if you smoke, drink alcohol, or use illegal drugs. Some items may interact with your medicine. What should I watch for while using this medicine? You may need blood work while you are taking this medicine. What side effects may I notice from receiving this medicine? Side effects that you should report to your doctor or health care professional as soon as possible: -allergic reactions like skin rash, itching or hives, swelling of the face, lips, or tongue -signs and symptoms of infection like fever or chills; cough; sore throat; pain or trouble passing urine Side effects that usually do not require medical attention (report to your doctor or health care professional if they continue or are bothersome): -diarrhea -nausea -muscle pain -pain, redness, or irritation at site where injected This list may not describe all possible side effects. Call your doctor for medical advice about side effects. You may report side effects to FDA at 1-800-FDA-1088. Where should I keep my medicine? Keep out of the reach of children. You will be instructed on how to store this medicine. Throw away any unused medicine after the expiration date on the label. NOTE: This sheet is a summary. It may not cover all possible information. If you have questions about this medicine, talk to your doctor, pharmacist, or health care   provider.  2018 Elsevier/Gold Standard (2016-06-14 13:21:53)  

## 2017-10-11 LAB — CBC WITH DIFFERENTIAL/PLATELET
BASOS ABS: 21 {cells}/uL (ref 0–200)
Basophils Relative: 0.3 %
Eosinophils Absolute: 308 cells/uL (ref 15–500)
Eosinophils Relative: 4.4 %
HCT: 41 % (ref 35.0–45.0)
Hemoglobin: 14.5 g/dL (ref 11.7–15.5)
LYMPHS ABS: 1575 {cells}/uL (ref 850–3900)
MCH: 32 pg (ref 27.0–33.0)
MCHC: 35.4 g/dL (ref 32.0–36.0)
MCV: 90.5 fL (ref 80.0–100.0)
MPV: 10.7 fL (ref 7.5–12.5)
Monocytes Relative: 6.3 %
NEUTROS PCT: 66.5 %
Neutro Abs: 4655 cells/uL (ref 1500–7800)
Platelets: 256 10*3/uL (ref 140–400)
RBC: 4.53 10*6/uL (ref 3.80–5.10)
RDW: 12.8 % (ref 11.0–15.0)
Total Lymphocyte: 22.5 %
WBC mixed population: 441 cells/uL (ref 200–950)
WBC: 7 10*3/uL (ref 3.8–10.8)

## 2017-10-11 LAB — COMPLETE METABOLIC PANEL WITH GFR
AG Ratio: 2 (calc) (ref 1.0–2.5)
ALT: 28 U/L (ref 6–29)
AST: 24 U/L (ref 10–35)
Albumin: 4.6 g/dL (ref 3.6–5.1)
Alkaline phosphatase (APISO): 96 U/L (ref 33–130)
BUN: 16 mg/dL (ref 7–25)
CALCIUM: 9.6 mg/dL (ref 8.6–10.4)
CO2: 25 mmol/L (ref 20–32)
CREATININE: 0.9 mg/dL (ref 0.50–1.05)
Chloride: 106 mmol/L (ref 98–110)
GFR, EST AFRICAN AMERICAN: 81 mL/min/{1.73_m2} (ref >=60)
GFR, EST NON AFRICAN AMERICAN: 70 mL/min/{1.73_m2} (ref >=60)
GLUCOSE: 84 mg/dL (ref 65–99)
Globulin: 2.3 g/dL (calc) (ref 1.9–3.7)
Potassium: 4.1 mmol/L (ref 3.5–5.3)
Sodium: 141 mmol/L (ref 135–146)
TOTAL PROTEIN: 6.9 g/dL (ref 6.1–8.1)
Total Bilirubin: 0.5 mg/dL (ref 0.2–1.2)

## 2017-10-11 LAB — LIPID PANEL
Cholesterol: 320 mg/dL — ABNORMAL HIGH (ref <200)
HDL: 49 mg/dL — ABNORMAL LOW (ref >50)
LDL Cholesterol (Calc): 238 mg/dL (calc) — ABNORMAL HIGH
NON-HDL CHOLESTEROL (CALC): 271 mg/dL — AB (ref <130)
TRIGLYCERIDES: 162 mg/dL — AB (ref <150)
Total CHOL/HDL Ratio: 6.5 (calc) — ABNORMAL HIGH (ref <5.0)

## 2017-10-11 LAB — TSH: TSH: 1.49 m[IU]/L (ref 0.40–4.50)

## 2017-10-11 LAB — HEMOGLOBIN A1C
EAG (MMOL/L): 6.3 (calc)
Hgb A1c MFr Bld: 5.6 % of total Hgb (ref <5.7)
Mean Plasma Glucose: 114 (calc)

## 2017-10-13 ENCOUNTER — Telehealth: Payer: Self-pay | Admitting: Family Medicine

## 2017-10-13 NOTE — Telephone Encounter (Signed)
Copied from [place] [id number]. Topic: Quick Communication - See Telephone Encounter >> Oct 13, 2017  2:12 PM Synthia Innocent wrote: CRM for notification. See Telephone encounter for: 10/13/17. PA needed on Evolocumab (REPATHA) 140 MG/ML SOSY

## 2017-10-13 NOTE — Telephone Encounter (Signed)
PA was done online via CoverMyMeds on 10/11/17  Ramon Dredge (Key: [id number])   [employer] is reviewing your PA request. Typically an electronic response will be received within 72 hours. To check for an update later, open this request from your dashboard. You may close this dialog and return to your dashboard to perform other tasks.

## 2017-10-14 ENCOUNTER — Other Ambulatory Visit: Payer: Self-pay | Admitting: Family Medicine

## 2017-10-14 DIAGNOSIS — H6992 Unspecified Eustachian tube disorder, left ear: Secondary | ICD-10-CM

## 2017-10-14 DIAGNOSIS — J4541 Moderate persistent asthma with (acute) exacerbation: Secondary | ICD-10-CM

## 2017-10-14 MED ORDER — PREDNISONE 10 MG PO TABS: 10.0000 mg | ORAL_TABLET | Freq: Every day | ORAL | 0 refills | Status: DC

## 2017-10-14 NOTE — Telephone Encounter (Signed)
Copied from [place] [id number]. Topic: Quick Communication - Rx Refill/Question >> Oct 14, 2017 10:09 AM [place], Gail Ross wrote: Medication: predniSONE (DELTASONE) 10 MG tablet [[id number]]  Has the patient contacted their pharmacy? No. (Agent: If no, request that the patient contact the pharmacy for the refill.) Preferred Pharmacy (with phone number or street name): [facility] [place], [address] AT [address] [phone] (Phone) [phone] (Fax)  Pt states she feel as if the prednisone is working but she is on her last dosage. Pt would like to know if refill could be [facility] into [facility].   Agent: Please be advised that RX refills may take up to 3 business days. We ask that you follow-up with your pharmacy.

## 2017-10-14 NOTE — Telephone Encounter (Signed)
Sent just for once a day, for 3 days, after that stop

## 2017-10-17 ENCOUNTER — Other Ambulatory Visit: Payer: Self-pay | Admitting: Family Medicine

## 2017-10-17 MED ORDER — EZETIMIBE 10 MG PO TABS: 10.0000 mg | ORAL_TABLET | Freq: Every day | ORAL | 0 refills | Status: DC

## 2017-11-11 ENCOUNTER — Other Ambulatory Visit: Payer: Self-pay | Admitting: Family Medicine

## 2017-11-11 DIAGNOSIS — J4540 Moderate persistent asthma, uncomplicated: Secondary | ICD-10-CM

## 2017-12-15 ENCOUNTER — Telehealth: Payer: Self-pay | Admitting: Family Medicine

## 2017-12-15 NOTE — Telephone Encounter (Unsigned)
Copied from [place] [id number]. Topic: Quick Communication - See Telephone Encounter >> Dec 15, 2017  8:36 AM Boyd Kerbs wrote: CRM for notification. See Telephone encounter for: 12/15/17.  Pt. Is asking nurse to call her:  You [place]  affecting ears, sinuses.  Asking for prednisone   [facility] - [place], [place] - [address] AT [address] [place] [address] Phone: [phone] Fax: [phone]

## 2017-12-16 NOTE — Telephone Encounter (Signed)
Sorry, we don't call in prednisone

## 2018-01-16 ENCOUNTER — Ambulatory Visit: Payer: 59 | Admitting: Family Medicine

## 2018-02-01 ENCOUNTER — Ambulatory Visit: Payer: 59 | Admitting: Family Medicine

## 2018-02-15 ENCOUNTER — Other Ambulatory Visit: Payer: Self-pay | Admitting: Family Medicine

## 2018-02-15 DIAGNOSIS — G43009 Migraine without aura, not intractable, without status migrainosus: Secondary | ICD-10-CM

## 2018-02-15 MED ORDER — SUMATRIPTAN SUCCINATE 6 MG/0.5ML ~~LOC~~ SOAJ: SUBCUTANEOUS | 0 refills | Status: DC

## 2018-02-15 NOTE — Telephone Encounter (Signed)
Copied from [place] [id number]. Topic: Quick Communication - Rx Refill/Question >> Feb 15, 2018 11:21 AM Scherrie Gerlach wrote: Medication: SUMAtriptan 6 MG/0.5ML SOAJ Pt has appt on 8/26, but is out of this med.  Pt has a migraine today, and needs asap please.  [place] [place], [address] AT [place] [phone] (Phone) [phone] (Fax)

## 2018-03-06 ENCOUNTER — Ambulatory Visit: Payer: 59 | Admitting: Family Medicine

## 2018-03-06 ENCOUNTER — Encounter

## 2018-03-07 ENCOUNTER — Other Ambulatory Visit: Payer: Self-pay | Admitting: Family Medicine

## 2018-03-07 DIAGNOSIS — G43009 Migraine without aura, not intractable, without status migrainosus: Secondary | ICD-10-CM

## 2018-03-16 ENCOUNTER — Other Ambulatory Visit: Payer: Self-pay | Admitting: Family Medicine

## 2018-03-16 DIAGNOSIS — G43009 Migraine without aura, not intractable, without status migrainosus: Secondary | ICD-10-CM

## 2018-03-16 NOTE — Telephone Encounter (Signed)
[facility] request for general medication[facility]   Last office visit: 10/10/2017   No follow-ups on file.

## 2018-04-21 ENCOUNTER — Other Ambulatory Visit: Payer: Self-pay

## 2018-04-21 DIAGNOSIS — G43009 Migraine without aura, not intractable, without status migrainosus: Secondary | ICD-10-CM

## 2018-04-21 NOTE — Telephone Encounter (Signed)
[facility]ill request for general medication. [facility]   Last office visit 10/10/2017   No follow-ups on file.

## 2018-04-21 NOTE — Telephone Encounter (Signed)
It has been 6 months since last visit, she needs to get on the schedule please

## 2018-04-27 ENCOUNTER — Ambulatory Visit (INDEPENDENT_AMBULATORY_CARE_PROVIDER_SITE_OTHER): Payer: 59 | Admitting: Family Medicine

## 2018-04-27 ENCOUNTER — Encounter: Payer: Self-pay | Admitting: Family Medicine

## 2018-04-27 VITALS — BP 126/70 | HR 85 | Temp 98.0°F | Ht 67.0 in | Wt 192.5 lb

## 2018-04-27 DIAGNOSIS — J4540 Moderate persistent asthma, uncomplicated: Secondary | ICD-10-CM

## 2018-04-27 DIAGNOSIS — M791 Myalgia, unspecified site: Secondary | ICD-10-CM

## 2018-04-27 DIAGNOSIS — T466X5A Adverse effect of antihyperlipidemic and antiarteriosclerotic drugs, initial encounter: Secondary | ICD-10-CM

## 2018-04-27 DIAGNOSIS — E669 Obesity, unspecified: Secondary | ICD-10-CM

## 2018-04-27 DIAGNOSIS — Z23 Encounter for immunization: Secondary | ICD-10-CM

## 2018-04-27 DIAGNOSIS — G43101 Migraine with aura, not intractable, with status migrainosus: Secondary | ICD-10-CM

## 2018-04-27 DIAGNOSIS — R7303 Prediabetes: Secondary | ICD-10-CM

## 2018-04-27 DIAGNOSIS — E7849 Other hyperlipidemia: Secondary | ICD-10-CM

## 2018-04-27 MED ORDER — DEXAMETHASONE SODIUM PHOSPHATE 4 MG/ML IJ SOLN
8.0000 mg | Freq: Once | INTRAMUSCULAR | Status: AC
  Administered 2018-04-27: 8 mg via INTRAMUSCULAR

## 2018-04-27 MED ORDER — ERENUMAB-AOOE 140 MG/ML ~~LOC~~ SOAJ: 140.0000 mg | SUBCUTANEOUS | 5 refills | Status: DC

## 2018-04-27 MED ORDER — FLUTICASONE FUROATE-VILANTEROL 100-25 MCG/INH IN AEPB: 1.0000 | INHALATION_SPRAY | Freq: Every day | RESPIRATORY_TRACT | 1 refills | Status: DC

## 2018-04-27 MED ORDER — MONTELUKAST SODIUM 10 MG PO TABS: 10.0000 mg | ORAL_TABLET | Freq: Every day | ORAL | 1 refills | Status: DC

## 2018-04-27 MED ORDER — SUMATRIPTAN SUCCINATE 6 MG/0.5ML ~~LOC~~ SOAJ: SUBCUTANEOUS | 2 refills | Status: DC

## 2018-04-27 MED ORDER — FREMANEZUMAB-VFRM 225 MG/1.5ML ~~LOC~~ SOSY: 225.0000 mg | PREFILLED_SYRINGE | SUBCUTANEOUS | 2 refills | Status: DC

## 2018-04-27 NOTE — Progress Notes (Signed)
Name: Gail Ross   MRN: [id number]    DOB: [birth year]   Date:04/27/2018       Progress Note  Subjective  Chief Complaint  Chief Complaint  Patient presents with  . Medication Refill  . Migraine    onset this morning around 4am. third migraine this week    HPI  Migraine headache: she is under more stress at work, looking for other jobs. Having more frequent migraine episodes. She is having episodes 12 migraine days. Imitrex usually makes it go away within minutes. Using all 10 per month and still runs short of medication. This episodes started two days ago, associated with photophobia, no phonophobia. She also has nausea, no vomiting. Migraine is described as tightness, starts on nuchal and radiates to behind right eye.   Obesity:lost a few pounds since last visit with life style modification, could not afford belviq. Eating healthier and will start going to the gym soon  Familial hyperlipidemia: she was taking Crestor 40 mg since 03/2016 , but has noticed a lot muscle pain, spasms, wechanged to 20 mg and added Co-Q 10 however leg pain got worse and she had to stop medication again a couple of months ago. Family history significant for heart disease, has one paternal uncle and father that died from heart disease in their 67's , we tried Zetia but could not afford medication   Asthma Moderate: she used to see Dr. Kerney Elbe had a gap in insurance and had a severe last Fall, however she has been on Breo daily and no wheezing, denies dry cough or SOB. She has not used rescue inhaler in months. She has some allergy symptoms with rhinorrhea and nasal congestion.      Patient Active Problem List   Diagnosis Date Noted  . Familial hyperlipidemia 10/10/2017  . Pre-diabetes 08/18/2016  . Obesity 04/05/2016  . Hallux limitus 12/21/2015  . Migraine without aura, not intractable 12/23/2014  . Mild intermittent asthma 12/23/2014  . Controlled insomnia 12/23/2014  . Hyperlipidemia  12/23/2014  . Allergic rhinitis 12/23/2014  . History of shingles 12/23/2014  . History of postmenopausal bleeding 12/23/2014  . 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 BREAST BIOPSY Left   . CERVICAL POLYPECTOMY  07/26/12  . halux implant arthro Right 01/21/2016   Dr. Jacqualyn Posey  . TUBAL LIGATION      Family History  Problem Relation Age of Onset  . Hypertension Mother   . Heart attack Father   . Heart attack Paternal Uncle   . Heart attack Paternal Grandfather     Social History   Socioeconomic History  . Marital status: Divorced    Spouse name: Not on file  . Number of children: 0  . Years of education: Not on file  . Highest education level: Some college, no degree  Occupational History  . 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 Financial resource strain: Not on file  . Food insecurity:    Worry: Not on file    Inability: Not on file  . Transportation needs:    Medical: Not on file    Non-medical: Not on file  Tobacco Use  . Smoking status: Never Smoker  . Smokeless tobacco: Never Used  Substance and Sexual Activity  . Alcohol use: Yes    Alcohol/week: 0.0 standard drinks    Comment: occasionally  . Drug use: No  . Sexual activity: Not Currently  Lifestyle  .  Physical activity:    Days per week: Not on file    Minutes per session: Not on file  . Stress: Not on file  Relationships  . Social connections:    Talks on phone: Not on file    Gets together: Not on file    Attends religious service: Not on file    Active member of club or organization: Not on file    Attends meetings of clubs or organizations: Not on file    Relationship status: Not on file  . Intimate partner violence:    Fear of current or ex partner: Not on file    Emotionally abused: Not on file    Physically abused: Not on file    Forced sexual activity: Not on file  Other Topics Concern  . Not on file   Social History Narrative   Moved to [place] because of new job, but has to commute to [employer].    Lives alone.    Married but not together- never got a divorce     Current Outpatient Medications:  .  albuterol (VENTOLIN HFA) 108 (90 Base) MCG/ACT inhaler, Inhale 2 puffs into the lungs every 6 (six) hours as needed for wheezing or shortness of breath., Disp: 1 each, Rfl: 0 .  cetirizine (ZYRTEC) 10 MG tablet, Take 10 mg by mouth daily., Disp: , Rfl:  .  ezetimibe (ZETIA) 10 MG tablet, Take 1 tablet (10 mg total) by mouth daily., Disp: 30 tablet, Rfl: 0 .  fluticasone (FLONASE) 50 MCG/ACT nasal spray, Place 1 spray into both nostrils daily. , Disp: , Rfl:  .  fluticasone furoate-vilanterol (BREO ELLIPTA) 100-25 MCG/INH AEPB, Inhale 1 puff into the lungs daily., Disp: 180 each, Rfl: 1 .  montelukast (SINGULAIR) 10 MG tablet, Take 1 tablet (10 mg total) by mouth at bedtime., Disp: 90 tablet, Rfl: 1 .  SUMAtriptan 6 MG/0.5ML SOAJ, inject 1 dose subcutaneously for migraines *MAX DOSE OF 1 PER 24 HOURS, Disp: 2 Syringe, Rfl: 0 .  valACYclovir (VALTREX) 1000 MG tablet, Take 1,000 mg by mouth. Eye doctor, Disp: , Rfl:  .  predniSONE (DELTASONE) 10 MG tablet, Take 1 tablet (10 mg total) by mouth daily with breakfast. (Patient not taking: Reported on 04/27/2018), Disp: 3 tablet, Rfl: 0  Allergies  Allergen Reactions  . Penicillins Anaphylaxis and Hives  . Statins     Muscle ache  . Topamax [Topiramate]   . Tree Extract Other (See Comments)    Sneezing   . Victoza [Liraglutide]     Indigestion and worsening of migraines    I personally reviewed active problem list, medication list, allergies, family history, social history with the patient/caregiver today.   ROS  Constitutional: Negative for fever or weight change.  Respiratory: Negative for cough and shortness of breath.   Cardiovascular: Negative for chest pain or palpitations.  Gastrointestinal: Negative for abdominal  pain, no bowel changes.  Musculoskeletal: Negative for gait problem or joint swelling.  Skin: Negative for rash.  Neurological: Negative for dizziness, positive for intermittent headache.  No other specific complaints in a complete review of systems (except as listed in HPI above).  Objective  Vitals:   04/27/18 1022  BP: 126/70  Pulse: 85  Temp: 98 F (36.7 C)  SpO2: 98%  Weight: 192 lb 8 oz (87.3 kg)  Height: 5\' 7"  (1.702 m)    Body mass index is 30.15 kg/m.  Physical Exam  Constitutional: Patient appears well-developed and well-nourished. Obese  No distress.  HEENT: head atraumatic, normocephalic, pupils equal and reactive to light,neck supple, throat within normal limits Cardiovascular: Normal rate, regular rhythm and normal heart sounds.  No murmur heard. No BLE edema. Pulmonary/Chest: Effort normal and breath sounds normal. No respiratory distress. Abdominal: Soft.  There is no tenderness. Neurological: photophobia, no focal findings.  Psychiatric: Patient has a normal mood and affect. behavior is normal. Judgment and thought content normal.  PHQ2/9: Depression screen [facility] 2/9 04/27/2018 10/10/2017 08/18/2016 04/05/2016 12/23/2014  Decreased Interest 0 1 0 0 0  Down, Depressed, Hopeless 0 1 0 0 0  PHQ - 2 Score 0 2 0 0 0  Altered sleeping - 0 - - -  Tired, decreased energy - 1 - - -  Change in appetite - 1 - - -  Feeling bad or failure about yourself  - 0 - - -  Trouble concentrating - 0 - - -  Moving slowly or fidgety/restless - 0 - - -  Suicidal thoughts - 0 - - -  PHQ-9 Score - 4 - - -  Difficult doing work/chores - Not difficult at all - - -     Fall Risk: Fall Risk  04/27/2018 08/18/2016 04/05/2016 12/23/2014  Falls in the past year? No No No No     Functional Status Survey: Is the patient deaf or have difficulty hearing?: No Does the patient have difficulty seeing, even when wearing glasses/contacts?: No Does the patient have difficulty concentrating,  remembering, or making decisions?: No Does the patient have difficulty walking or climbing stairs?: No Does the patient have difficulty dressing or bathing?: No Does the patient have difficulty doing errands alone such as visiting a doctor's office or shopping?: No    Assessment & Plan  1. Migraine with aura and with status migrainosus, not intractable  - SUMAtriptan 6 MG/0.5ML SOAJ; Daily prn, no more than 2 in 24 hours  Dispense: 10 Syringe; Refill: 2 - dexamethasone (DECADRON) injection 8 mg - Fremanezumab-vfrm (AJOVY) 225 MG/1.5ML SOSY; Inject 225 mg into the skin every 30 (thirty) days.  Dispense: 1 mL; Refill: 2 - Fremanezumab-vfrm (AJOVY) 225 MG/1.5ML SOSY; Inject 225 mg into the skin every 30 (thirty) days.  Dispense: 1 mL; Refill: 2   2. Need for influenza vaccination  - Flu Vaccine QUAD 6+ mos PF IM (Fluarix Quad PF)  3. Moderate persistent asthma without complication  - fluticasone furoate-vilanterol (BREO ELLIPTA) 100-25 MCG/INH AEPB; Inhale 1 puff into the lungs daily.  Dispense: 180 each; Refill: 1 - montelukast (SINGULAIR) 10 MG tablet; Take 1 tablet (10 mg total) by mouth at bedtime.  Dispense: 90 tablet; Refill: 1  4. Pre-diabetes  Discussed life style modification   5. Obesity (BMI 30.0-34.9)  Discussed with the patient the risk posed by an increased BMI. Discussed importance of portion control, calorie counting and at least 150 minutes of physical activity weekly. Avoid sweet beverages and drink more water. Eat at least 6 servings of fruit and vegetables daily   6. Familial hyperlipidemia  She cannot tolerate statin therapy, she could not afford zetia, discussed PSK9 and we may start next visit

## 2018-05-05 ENCOUNTER — Other Ambulatory Visit: Payer: Self-pay | Admitting: Family Medicine

## 2018-05-05 DIAGNOSIS — J4540 Moderate persistent asthma, uncomplicated: Secondary | ICD-10-CM

## 2018-05-24 NOTE — Telephone Encounter (Signed)
Pt already came in for her appt

## 2018-08-11 ENCOUNTER — Encounter: Payer: 59 | Admitting: Family Medicine

## 2019-01-20 ENCOUNTER — Other Ambulatory Visit: Payer: Self-pay | Admitting: Family Medicine

## 2019-01-20 DIAGNOSIS — J4540 Moderate persistent asthma, uncomplicated: Secondary | ICD-10-CM

## 2019-01-21 ENCOUNTER — Other Ambulatory Visit: Payer: Self-pay | Admitting: Family Medicine

## 2019-01-21 DIAGNOSIS — J4540 Moderate persistent asthma, uncomplicated: Secondary | ICD-10-CM

## 2019-01-22 NOTE — Telephone Encounter (Signed)
Pt scheduled  

## 2019-02-23 ENCOUNTER — Other Ambulatory Visit: Payer: Self-pay | Admitting: Family Medicine

## 2019-02-23 DIAGNOSIS — G43101 Migraine with aura, not intractable, with status migrainosus: Secondary | ICD-10-CM

## 2019-03-06 ENCOUNTER — Ambulatory Visit: Payer: 59 | Admitting: Family Medicine

## 2019-03-15 ENCOUNTER — Ambulatory Visit: Payer: 59 | Admitting: Family Medicine

## 2019-04-25 ENCOUNTER — Other Ambulatory Visit: Payer: Self-pay | Admitting: Family Medicine

## 2019-04-25 DIAGNOSIS — J4540 Moderate persistent asthma, uncomplicated: Secondary | ICD-10-CM

## 2019-04-25 DIAGNOSIS — G43101 Migraine with aura, not intractable, with status migrainosus: Secondary | ICD-10-CM

## 2019-04-25 NOTE — Telephone Encounter (Signed)
Requested medication (s) are due for refill today: yes  Requested medication (s) are on the active medication list:yes  Last refill:  04/11/2018  Future visit scheduled: no  Notes to clinic:  Review for refill   Requested Prescriptions  Pending Prescriptions Disp Refills   SUMAtriptan 6 MG/0.5ML SOAJ [Pharmacy Med Name: SUMATRIPTAN 6MG /0.5 INJ 2X0.5ML] 1 mL     Sig: INJECT 1 DOSE UNDER THE SKIN FOR MIGRAINES. MAX DOSE 1 PER 24 HOURS     Neurology:  Migraine Therapy - Triptan Failed - 04/25/2019  9:54 AM      Failed - Valid encounter within last 12 months    Recent Outpatient Visits          12 months ago Migraine with aura and with status migrainosus, not intractable   [facility] Steele Sizer, MD   1 year ago Pre-diabetes   [facility] [place], Drue Stager, MD   2 years ago Migraine without aura and without status migrainosus, not intractable   [facility] Steele Sizer, MD   2 years ago Pure hypercholesterolemia   [facility] Steele Sizer, MD   3 years ago Hyperlipidemia   [facility] [place], Drue Stager, MD             Passed - Last BP in normal range    BP Readings from Last 1 Encounters:  04/27/18 126/70

## 2019-04-25 NOTE — Telephone Encounter (Signed)
Requested medication (s) are due for refill today: yes  Requested medication (s) are on the active medication list: yes  Last refill:  01/22/2019  Future visit scheduled: no  Notes to clinic:  Review for refill  Requested Prescriptions  Pending Prescriptions Disp Refills   montelukast (SINGULAIR) 10 MG tablet [Pharmacy Med Name: MONTELUKAST 10MG  TABLETS] 90 tablet 0    Sig: TAKE 1 TABLET(10 MG) BY MOUTH AT BEDTIME     Pulmonology:  Leukotriene Inhibitors Failed - 04/25/2019  9:51 AM      Failed - Valid encounter within last 12 months    Recent Outpatient Visits          12 months ago Migraine with aura and with status migrainosus, not intractable   [facility] Steele Sizer, MD   1 year ago Pre-diabetes   [facility] [place], Drue Stager, MD   2 years ago Migraine without aura and without status migrainosus, not intractable   [facility] Steele Sizer, MD   2 years ago Pure hypercholesterolemia   [facility] Steele Sizer, MD   3 years ago Hyperlipidemia   [facility] Steele Sizer, MD

## 2019-04-26 ENCOUNTER — Ambulatory Visit: Payer: Self-pay | Admitting: Family Medicine

## 2019-04-26 NOTE — Telephone Encounter (Signed)
lvm asking pt to schedule appt. Per Dr Ancil Boozer pt hasnt been seen in a while

## 2019-05-11 ENCOUNTER — Ambulatory Visit: Payer: Self-pay | Admitting: Family Medicine

## 2019-05-29 ENCOUNTER — Ambulatory Visit: Payer: Self-pay | Admitting: Family Medicine

## 2019-05-29 ENCOUNTER — Other Ambulatory Visit: Payer: Self-pay

## 2019-05-31 ENCOUNTER — Other Ambulatory Visit (HOSPITAL_COMMUNITY)
Admission: RE | Admit: 2019-05-31 | Discharge: 2019-05-31 | Disposition: A | Discharge status: Home / Self Care | Payer: 59 | Source: Ambulatory Visit | Attending: Family Medicine | Admitting: Family Medicine

## 2019-05-31 ENCOUNTER — Ambulatory Visit (INDEPENDENT_AMBULATORY_CARE_PROVIDER_SITE_OTHER): Payer: 59 | Admitting: Family Medicine

## 2019-05-31 ENCOUNTER — Encounter: Payer: Self-pay | Admitting: Family Medicine

## 2019-05-31 ENCOUNTER — Other Ambulatory Visit: Payer: Self-pay

## 2019-05-31 VITALS — BP 112/70 | HR 88 | Temp 97.5°F | Resp 16 | Ht 67.0 in | Wt 190.5 lb

## 2019-05-31 DIAGNOSIS — Z1211 Encounter for screening for malignant neoplasm of colon: Secondary | ICD-10-CM

## 2019-05-31 DIAGNOSIS — Z23 Encounter for immunization: Secondary | ICD-10-CM

## 2019-05-31 DIAGNOSIS — E538 Deficiency of other specified B group vitamins: Secondary | ICD-10-CM

## 2019-05-31 DIAGNOSIS — G43101 Migraine with aura, not intractable, with status migrainosus: Secondary | ICD-10-CM | POA: Diagnosis not present

## 2019-05-31 DIAGNOSIS — R7303 Prediabetes: Secondary | ICD-10-CM

## 2019-05-31 DIAGNOSIS — Z124 Encounter for screening for malignant neoplasm of cervix: Secondary | ICD-10-CM

## 2019-05-31 DIAGNOSIS — Z1231 Encounter for screening mammogram for malignant neoplasm of breast: Secondary | ICD-10-CM

## 2019-05-31 DIAGNOSIS — R7989 Other specified abnormal findings of blood chemistry: Secondary | ICD-10-CM

## 2019-05-31 DIAGNOSIS — G43009 Migraine without aura, not intractable, without status migrainosus: Secondary | ICD-10-CM

## 2019-05-31 DIAGNOSIS — Z13220 Encounter for screening for lipoid disorders: Secondary | ICD-10-CM

## 2019-05-31 DIAGNOSIS — Z0000 Encounter for general adult medical examination without abnormal findings: Secondary | ICD-10-CM | POA: Diagnosis not present

## 2019-05-31 DIAGNOSIS — J4540 Moderate persistent asthma, uncomplicated: Secondary | ICD-10-CM

## 2019-05-31 DIAGNOSIS — B0231 Zoster conjunctivitis: Secondary | ICD-10-CM

## 2019-05-31 DIAGNOSIS — E559 Vitamin D deficiency, unspecified: Secondary | ICD-10-CM

## 2019-05-31 DIAGNOSIS — Z79899 Other long term (current) drug therapy: Secondary | ICD-10-CM

## 2019-05-31 MED ORDER — AJOVY 225 MG/1.5ML ~~LOC~~ SOSY: 225.0000 mg | PREFILLED_SYRINGE | SUBCUTANEOUS | 2 refills | Status: DC

## 2019-05-31 MED ORDER — SUMATRIPTAN SUCCINATE 6 MG/0.5ML ~~LOC~~ SOAJ: SUBCUTANEOUS | 0 refills | Status: DC

## 2019-05-31 MED ORDER — ALBUTEROL SULFATE HFA 108 (90 BASE) MCG/ACT IN AERS: 2.0000 | INHALATION_SPRAY | Freq: Four times a day (QID) | RESPIRATORY_TRACT | 0 refills | Status: DC | PRN

## 2019-05-31 MED ORDER — BREO ELLIPTA 100-25 MCG/INH IN AEPB: 1.0000 | INHALATION_SPRAY | Freq: Every day | RESPIRATORY_TRACT | 1 refills | Status: DC

## 2019-05-31 MED ORDER — MONTELUKAST SODIUM 10 MG PO TABS: 10.0000 mg | ORAL_TABLET | Freq: Every day | ORAL | 1 refills | Status: DC

## 2019-05-31 NOTE — Progress Notes (Signed)
Name: Gail Ross   MRN: [id number]    DOB: 21-Feb-1961   Date:05/31/2019       Progress Note  Subjective  Chief Complaint  Chief Complaint  Patient presents with  . Medication Refill  . Migraine    Has only had 2 in the past month  . Asthma    Has not had Breo in awhile which makes her SOB when she has flair ups  . Obesity    Has lost 2 pounds since last visit-04/27/2018  . Familial hyperlipidemia  . Well woman exam    HPI  Patient presents for annual CPE and follow up  Migraine headache: She is having episodes 3 -6 episodes of migraine per month, each episode can last up to 48 hours migraine days. Imitrex usually makes it go away within minutes, however at times is unable to use it while at work. Migraine episodes are described as throbbing sensation usually right side of head, from nuchal area and radiates to frontal area, associated with photophobia, no phonophobia. She also has nausea, no vomiting. Occasionally on left side, and it is usually more intense on left side. She tried Topamax but could not tolerate it .   Ophthalmic Shingles: going to [facility] and taking Valtrex, she had it before and she states she will need to go on maintenance medication  Obesity: weight has been stable. She has a history of pre-diabetes   Familial hyperlipidemia: shewas takingCrestor 40 mg since 03/2016 , but has noticed a lot muscle pain, spasms, wechanged to 20 mg and added Co-Q 10 howeverleg pain got worse and she had to stop medication again a couple of months ago. Family history significant for heart disease, has one paternal uncle and father that died from heart disease in their 76's. We will check labs again and try PSK9 injections if she qualifies   Asthma Moderate: she used to see Dr. Kerney Elbe had a gap in insurance and ran out of medication. She states she has been out of Breo for the past 4 months, she states still has some wheezing about once a week and daily dry  cough.   Vitamin D and B12 deficiency: we will recheck levels  Pre-diabetes: there is a family history of diabetes, she denies polyphagia, polydipsia or polyuria.   Diet: she has been cooking more at home since pandemic  Exercise: not currently    USPSTF grade A and B recommendations    Office Visit from 04/27/2018 in [facility]  AUDIT-C Score  0     Depression: Phq 9 is  negative Depression screen [facility] 2/9 05/31/2019 04/27/2018 10/10/2017 08/18/2016 04/05/2016  Decreased Interest 0 0 1 0 0  Down, Depressed, Hopeless 0 0 1 0 0  PHQ - 2 Score 0 0 2 0 0  Altered sleeping 1 - 0 - -  Tired, decreased energy 1 - 1 - -  Change in appetite 1 - 1 - -  Feeling bad or failure about yourself  0 - 0 - -  Trouble concentrating 0 - 0 - -  Moving slowly or fidgety/restless 0 - 0 - -  Suicidal thoughts 0 - 0 - -  PHQ-9 Score 3 - 4 - -  Difficult doing work/chores Not difficult at all - Not difficult at all - -   Hypertension: BP Readings from Last 3 Encounters:  05/31/19 112/70  04/27/18 126/70  10/10/17 120/80   Obesity: Wt Readings from Last 3 Encounters:  05/31/19 190 lb  8 oz (86.4 kg)  04/27/18 192 lb 8 oz (87.3 kg)  10/10/17 194 lb 14.4 oz (88.4 kg)   BMI Readings from Last 3 Encounters:  05/31/19 29.84 kg/m  04/27/18 30.15 kg/m  10/10/17 30.53 kg/m     Hep C Screening: up to date  STD testing and prevention (HIV/chl/gon/syphilis): not interested, not sexually active in years  Intimate partner violence: negative  Sexual History/Pain during Intercourse: N/A Menstrual History/LMP/Abnormal Bleeding: discussed importance of follow up if any post-menopausal bleeding  Incontinence Symptoms: no symptoms   Breast cancer:  - Last Mammogram: in 2017  - BRCA gene screening: N/A  Osteoporosis: discussed high calcium and vitamin D supplementation  Cervical cancer screening: today   Skin cancer: discussed atypical lesions  Colorectal cancer: Family history of  Chron's needs to have colonoscopy  Lung cancer:   Low Dose CT Chest recommended if Age 23-80 years, 30 pack-year currently smoking OR have quit w/in 15years. Patient does not qualify.   ECG: we will wait until next visit, not sure how good insurance is   Advanced Care Planning: A voluntary discussion about advance care planning including the explanation and discussion of advance directives.  Discussed health care proxy and Living will, and the patient was able to identify a health care proxy as previous  husband- Brynda Greathouse - they are friends.  Patient does not have a living will at present time.  Lipids: Lab Results  Component Value Date   CHOL 320 (H) 10/10/2017   CHOL 360 (H) 04/05/2016   Lab Results  Component Value Date   HDL 49 (L) 10/10/2017   HDL 60 04/05/2016   Lab Results  Component Value Date   LDLCALC 238 (H) 10/10/2017   LDLCALC 282 (H) 04/05/2016   Lab Results  Component Value Date   TRIG 162 (H) 10/10/2017   TRIG 90 04/05/2016   Lab Results  Component Value Date   CHOLHDL 6.5 (H) 10/10/2017   CHOLHDL 6.0 (H) 04/05/2016   No results found for: LDLDIRECT  Glucose: Glucose, Bld  Date Value Ref Range Status  10/10/2017 84 65 - 99 mg/dL Final    Comment:    .            Fasting reference interval .   04/05/2016 113 (H) 65 - 99 mg/dL Final    Patient Active Problem List   Diagnosis Date Noted  . Familial hyperlipidemia 10/10/2017  . Pre-diabetes 08/18/2016  . Obesity 04/05/2016  . Hallux limitus 12/21/2015  . Migraine without aura, not intractable 12/23/2014  . Mild intermittent asthma 12/23/2014  . Controlled insomnia 12/23/2014  . Hyperlipidemia 12/23/2014  . Allergic rhinitis 12/23/2014  . History of shingles 12/23/2014  . History of postmenopausal bleeding 12/23/2014  . 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 BREAST BIOPSY Left   . CERVICAL POLYPECTOMY  07/26/12  . halux implant arthro  Right 01/21/2016   Dr. Jacqualyn Posey  . TUBAL LIGATION      Family History  Problem Relation Age of Onset  . Hypertension Mother   . Dementia Mother   . Heart attack Father   . Heart attack Paternal Uncle   . Heart attack Paternal Grandfather   . Colitis Sister   . Diabetes Sister     Social History   Socioeconomic History  . Marital status: Divorced    Spouse name: Not on file  . Number of children: 0  . Years of education: Not  on file  . Highest education level: Some college, no degree  Occupational History  . Occupation: Press photographer    Comment: [name]  Social Needs  . Financial resource strain: Not hard at all  . Food insecurity    Worry: Never true    Inability: Never true  . Transportation needs    Medical: No    Non-medical: No  Tobacco Use  . Smoking status: Never Smoker  . Smokeless tobacco: Never Used  Substance and Sexual Activity  . Alcohol use: Yes    Alcohol/week: 0.0 standard drinks    Comment: occasionally  . Drug use: No  . Sexual activity: Not Currently  Lifestyle  . Physical activity    Days per week: 0 days    Minutes per session: 0 min  . Stress: Not at all  Relationships  . Social connections    Talks on phone: More than three times a week    Gets together: More than three times a week    Attends religious service: More than 4 times per year    Active member of club or organization: No    Attends meetings of clubs or organizations: Never    Relationship status: Separated  . Intimate partner violence    Fear of current or ex partner: No    Emotionally abused: No    Physically abused: No    Forced sexual activity: No  Other Topics Concern  . Not on file  Social History Narrative   Moved to [place] because of new job, but has to commute to [employer].    Lives alone.    Married but not together- never got a divorce     Current Outpatient Medications:  .  albuterol (VENTOLIN HFA) 108 (90 Base) MCG/ACT inhaler, Inhale 2  puffs into the lungs every 6 (six) hours as needed for wheezing or shortness of breath., Disp: 18 g, Rfl: 0 .  cetirizine (ZYRTEC) 10 MG tablet, Take 10 mg by mouth daily., Disp: , Rfl:  .  fluticasone (FLONASE) 50 MCG/ACT nasal spray, Place 1 spray into both nostrils daily. , Disp: , Rfl:  .  fluticasone furoate-vilanterol (BREO ELLIPTA) 100-25 MCG/INH AEPB, Inhale 1 puff into the lungs daily., Disp: 180 each, Rfl: 1 .  montelukast (SINGULAIR) 10 MG tablet, Take 1 tablet (10 mg total) by mouth at bedtime., Disp: 90 tablet, Rfl: 1 .  SUMAtriptan 6 MG/0.5ML SOAJ, INJECT UNDER THE SKIN DAILY AS NEEDED, NOT TO EXCEED 2 INJECTIONS IN 24 HOURS AS DIRECTED, Disp: 5 mL, Rfl: 0 .  valACYclovir (VALTREX) 1000 MG tablet, Take 1,000 mg by mouth. Eye doctor, Disp: , Rfl:  .  Fremanezumab-vfrm (AJOVY) 225 MG/1.5ML SOSY, Inject 225 mg into the skin every 30 (thirty) days. (Patient not taking: Reported on 05/31/2019), Disp: 1 mL, Rfl: 2  Allergies  Allergen Reactions  . Penicillins Anaphylaxis and Hives  . Statins     Muscle ache  . Topamax [Topiramate]   . Tree Extract Other (See Comments)    Sneezing   . Victoza [Liraglutide]     Indigestion and worsening of migraines     ROS  Constitutional: Negative for fever or weight change.  Respiratory: Negative for cough and shortness of breath.   Cardiovascular: Negative for chest pain or palpitations.  Gastrointestinal: Negative for abdominal pain, no bowel changes.  Musculoskeletal: Negative for gait problem or joint swelling.  Skin: Negative for rash.  Neurological: Negative for dizziness , positive for intermittent  headache.  No other specific complaints in a complete review of systems (except as listed in HPI above).  Objective  Vitals:   05/31/19 1112  BP: 112/70  Pulse: 88  Resp: 16  Temp: (!) 97.5 F (36.4 C)  TempSrc: Temporal  SpO2: 96%  Weight: 190 lb 8 oz (86.4 kg)  Height: _0  (1.702 m)    Body mass index is 29.84  kg/m.  Physical Exam  Constitutional: Patient appears well-developed and well-nourished. No distress.  HENT: Head: Normocephalic and atraumatic. Ears: B TMs ok, no erythema or effusion; Nose: Nose normal. Mouth/Throat: Oropharynx is clear and moist. No oropharyngeal exudate.  Eyes: Conjunctivae and EOM are normal. Pupils are equal, round, and reactive to light. No scleral icterus.  Neck: Normal range of motion. Neck supple. No JVD present. No thyromegaly present.  Cardiovascular: Normal rate, regular rhythm and normal heart sounds.  No murmur heard. No BLE edema. Pulmonary/Chest: Effort normal and breath sounds normal. No respiratory distress. Abdominal: Soft. Bowel sounds are normal, no distension. There is no tenderness. no masses Breast: no lumps or masses, no nipple discharge or rashes FEMALE GENITALIA:  External genitalia normal External urethra normal Vaginal vault normal without discharge or lesions Cervix normal without discharge or lesions Bimanual exam normal without masses RECTAL: not done Musculoskeletal: Normal range of motion, no joint effusions. No gross deformities Neurological: he is alert and oriented to person, place, and time. No cranial nerve deficit. Coordination, balance, strength, speech and gait are normal.  Skin: Skin is warm and dry. No rash noted. No erythema.  Psychiatric: Patient has a normal mood and affect. behavior is normal. Judgment and thought content normal.  Fall Risk: Fall Risk  05/31/2019 04/27/2018 08/18/2016 04/05/2016 12/23/2014  Falls in the past year? 0 No No No No  Number falls in past yr: 0 - - - -  Injury with Fall? 0 - - - -     Functional Status Survey: Is the patient deaf or have difficulty hearing?: No Does the patient have difficulty seeing, even when wearing glasses/contacts?: No Does the patient have difficulty concentrating, remembering, or making decisions?: No Does the patient have difficulty walking or climbing stairs?:  No Does the patient have difficulty dressing or bathing?: No Does the patient have difficulty doing errands alone such as visiting a doctor's office or shopping?: No   Assessment & Plan   1. Migraine with aura and with status migrainosus, not intractable  - Hemoglobin A1c - SUMAtriptan 6 MG/0.5ML SOAJ; INJECT UNDER THE SKIN DAILY AS NEEDED, NOT TO EXCEED 2 INJECTIONS IN 24 HOURS AS DIRECTED  Dispense: 5 mL; Refill: 0  2. Encounter for immunization  - Flu Vaccine MDCK QUAD PF  3. Colon cancer screening  - Ambulatory referral to Gastroenterology  4. Well adult exam   5. Breast cancer screening by mammogram  - MM 3D SCREEN BREAST BILATERAL; Future  6. Pre-diabetes  A1C  7. Moderate persistent asthma without complication  - montelukast (SINGULAIR) 10 MG tablet; Take 1 tablet (10 mg total) by mouth at bedtime.  Dispense: 90 tablet; Refill: 1 - fluticasone furoate-vilanterol (BREO ELLIPTA) 100-25 MCG/INH AEPB; Inhale 1 puff into the lungs daily.  Dispense: 180 each; Refill: 1 - albuterol (VENTOLIN HFA) 108 (90 Base) MCG/ACT inhaler; Inhale 2 puffs into the lungs every 6 (six) hours as needed for wheezing or shortness of breath.  Dispense: 18 g; Refill: 0  8. Long-term use of high-risk medication  - COMPLETE METABOLIC PANEL WITH GFR - CBC with  Differential/Platelet  9. Migraine without aura and without status migrainosus, not intractable  - Fremanezumab-vfrm (AJOVY) 225 MG/1.5ML SOSY; Inject 225 mg into the skin every 30 (thirty) days.  Dispense: 1 mL; Refill: 2  10. Lipid screening  - Lipid panel  11. Low TSH level  - TSH  12. Vitamin D deficiency  - VITAMIN D 25 Hydroxy (Vit-D Deficiency, Fractures)  13. Vitamin B12 deficiency  - Vitamin B12  14. 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

## 2019-05-31 NOTE — Patient Instructions (Signed)

## 2019-06-01 LAB — HEMOGLOBIN A1C
Hgb A1c MFr Bld: 5.6 % of total Hgb (ref <5.7)
Mean Plasma Glucose: 114 (calc)
eAG (mmol/L): 6.3 (calc)

## 2019-06-01 LAB — CBC WITH DIFFERENTIAL/PLATELET
Absolute Monocytes: 400 cells/uL (ref 200–950)
Basophils Absolute: 29 cells/uL (ref 0–200)
Basophils Relative: 0.5 %
Eosinophils Absolute: 557 cells/uL — ABNORMAL HIGH (ref 15–500)
Eosinophils Relative: 9.6 %
HCT: 38.8 % (ref 35.0–45.0)
Hemoglobin: 13 g/dL (ref 11.7–15.5)
Lymphs Abs: 1543 cells/uL (ref 850–3900)
MCH: 30.8 pg (ref 27.0–33.0)
MCHC: 33.5 g/dL (ref 32.0–36.0)
MCV: 91.9 fL (ref 80.0–100.0)
MPV: 10.3 fL (ref 7.5–12.5)
Monocytes Relative: 6.9 %
Neutro Abs: 3271 cells/uL (ref 1500–7800)
Neutrophils Relative %: 56.4 %
Platelets: 245 10*3/uL (ref 140–400)
RBC: 4.22 10*6/uL (ref 3.80–5.10)
RDW: 13.5 % (ref 11.0–15.0)
Total Lymphocyte: 26.6 %
WBC: 5.8 10*3/uL (ref 3.8–10.8)

## 2019-06-01 LAB — COMPLETE METABOLIC PANEL WITH GFR
AG Ratio: 1.7 (calc) (ref 1.0–2.5)
ALT: 25 U/L (ref 6–29)
AST: 25 U/L (ref 10–35)
Albumin: 4.1 g/dL (ref 3.6–5.1)
Alkaline phosphatase (APISO): 101 U/L (ref 37–153)
BUN: 15 mg/dL (ref 7–25)
CO2: 26 mmol/L (ref 20–32)
Calcium: 9 mg/dL (ref 8.6–10.4)
Chloride: 106 mmol/L (ref 98–110)
Creat: 0.88 mg/dL (ref 0.50–0.99)
GFR, Est African American: 83 mL/min/{1.73_m2} (ref >=60)
GFR, Est Non African American: 71 mL/min/{1.73_m2} (ref >=60)
Globulin: 2.4 g/dL (calc) (ref 1.9–3.7)
Glucose, Bld: 94 mg/dL (ref 65–99)
Potassium: 4.1 mmol/L (ref 3.5–5.3)
Sodium: 139 mmol/L (ref 135–146)
Total Bilirubin: 0.4 mg/dL (ref 0.2–1.2)
Total Protein: 6.5 g/dL (ref 6.1–8.1)

## 2019-06-01 LAB — VITAMIN D 25 HYDROXY (VIT D DEFICIENCY, FRACTURES): Vit D, 25-Hydroxy: 15 ng/mL — ABNORMAL LOW (ref 30–100)

## 2019-06-01 LAB — LIPID PANEL
Cholesterol: 312 mg/dL — ABNORMAL HIGH (ref <200)
HDL: 46 mg/dL — ABNORMAL LOW (ref >=50)
LDL Cholesterol (Calc): 215 mg/dL (calc) — ABNORMAL HIGH
Non-HDL Cholesterol (Calc): 266 mg/dL (calc) — ABNORMAL HIGH (ref <130)
Total CHOL/HDL Ratio: 6.8 (calc) — ABNORMAL HIGH (ref <5.0)
Triglycerides: 284 mg/dL — ABNORMAL HIGH (ref <150)

## 2019-06-01 LAB — VITAMIN B12: Vitamin B-12: 305 pg/mL (ref 200–1100)

## 2019-06-01 LAB — TSH: TSH: 1.62 mIU/L (ref 0.40–4.50)

## 2019-06-04 ENCOUNTER — Other Ambulatory Visit: Payer: Self-pay | Admitting: Family Medicine

## 2019-06-04 DIAGNOSIS — E559 Vitamin D deficiency, unspecified: Secondary | ICD-10-CM

## 2019-06-04 DIAGNOSIS — E7849 Other hyperlipidemia: Secondary | ICD-10-CM

## 2019-06-04 LAB — CYTOLOGY - PAP
Comment: NEGATIVE
Diagnosis: NEGATIVE
High risk HPV: POSITIVE — AB

## 2019-06-04 MED ORDER — EZETIMIBE 10 MG PO TABS: 10.0000 mg | ORAL_TABLET | Freq: Every day | ORAL | 1 refills | Status: DC

## 2019-06-04 MED ORDER — VITAMIN D (ERGOCALCIFEROL) 1.25 MG (50000 UNIT) PO CAPS: 50000.0000 [IU] | ORAL_CAPSULE | ORAL | 0 refills | Status: DC

## 2019-06-04 MED ORDER — OMEGA-3-ACID ETHYL ESTERS 1 G PO CAPS: 2.0000 g | ORAL_CAPSULE | Freq: Two times a day (BID) | ORAL | 1 refills | Status: DC

## 2019-06-26 ENCOUNTER — Encounter: Payer: Self-pay | Admitting: *Deleted

## 2019-07-10 ENCOUNTER — Other Ambulatory Visit: Payer: Self-pay | Admitting: Family Medicine

## 2019-07-10 NOTE — Telephone Encounter (Signed)
Pt request refill  valACYclovir (VALTREX) 1000 MG tablet  Pt states Dr Ancil Boozer told her to call when/if she needs this med, and pt states she is having outbreak at eye again and needs asap.   [id number] - [address], [place] - [address] Phone:  [phone]  Fax:  [phone]

## 2019-08-20 ENCOUNTER — Telehealth: Payer: Self-pay

## 2019-08-20 NOTE — Telephone Encounter (Signed)
Copied from [place] [id number]. Topic: General - Other >> Aug 20, 2019 11:11 AM Antonieta Iba C wrote: Reason for CRM: pt called in for assistance. Pt says that she is currently prescribed fluticasone furoate-vilanterol (BREO ELLIPTA) 100-25 MCG/INH AEPB. Pt says medication is to expensive pt would like assistance with getting a different medication.   Please assist.

## 2019-08-22 ENCOUNTER — Telehealth: Payer: Self-pay

## 2019-08-22 MED ORDER — FLUTICASONE-SALMETEROL 250-50 MCG/DOSE IN AEPB: 1.0000 | INHALATION_SPRAY | Freq: Two times a day (BID) | RESPIRATORY_TRACT | 2 refills | Status: DC

## 2019-08-22 NOTE — Telephone Encounter (Signed)
Patient called back and states Insurance prefers Advair 250/50 in 1 month increments instead of Breo Inhalers.

## 2019-08-22 NOTE — Telephone Encounter (Signed)
Patient will call insurance and find out which one is on formulary  Advair, Symbicort? Dulera? and call us back with information.

## 2019-09-10 ENCOUNTER — Telehealth: Payer: Self-pay | Admitting: Family Medicine

## 2019-09-10 NOTE — Telephone Encounter (Signed)
Pt did not want to make a virtual appt now. Pt states dr Ancil Boozer normally would call prednisone is due weather changed she is have ear issues and would like prednisone. [address] in [place]

## 2019-09-11 NOTE — Telephone Encounter (Signed)
Patient states she will do a virtual with Dr. Ancil Boozer because her ears have been bothering her for 2 weeks now and she has done everything possible otc and nothing is working. Please schedule her an visit.

## 2019-09-11 NOTE — Telephone Encounter (Signed)
Virtual appt scheduled

## 2019-09-12 ENCOUNTER — Ambulatory Visit (INDEPENDENT_AMBULATORY_CARE_PROVIDER_SITE_OTHER): Payer: Managed Care, Other (non HMO) | Admitting: Family Medicine

## 2019-09-12 ENCOUNTER — Other Ambulatory Visit: Payer: Self-pay

## 2019-09-12 ENCOUNTER — Encounter: Payer: Self-pay | Admitting: Family Medicine

## 2019-09-12 VITALS — HR 86 | Temp 97.5°F | Ht 67.0 in | Wt 180.0 lb

## 2019-09-12 DIAGNOSIS — H6992 Unspecified Eustachian tube disorder, left ear: Secondary | ICD-10-CM | POA: Diagnosis not present

## 2019-09-12 MED ORDER — PREDNISONE 10 MG (21) PO TBPK: ORAL_TABLET | ORAL | 0 refills | Status: DC

## 2019-09-12 NOTE — Progress Notes (Signed)
Name: Gail Ross   MRN: [id number]    DOB: 30-Aug-1960   Date:09/12/2019       Progress Note  Subjective  Chief Complaint  Chief Complaint  Patient presents with  . Ear Fullness    Feels like her ears are under water. Left ear is clogged the worst-feels like fluid is in there and can't get it to pop.  Prednisone has helped in the past.   . Nasal Congestion    Runny nose with weather change  . Ear Pain    Itchy    I connected with  [name]  on 09/12/19 at 11:40 AM EST by a video enabled telemedicine application and verified that I am speaking with the correct person using two identifiers.  I discussed the limitations of evaluation and management by telemedicine and the availability of in person appointments. The patient expressed understanding and agreed to proceed. Staff also discussed with the patient that there may be a patient responsible charge related to this service. Patient Location: at work  Provider Location: [employer]   HPI  Mother died in 06/29/23 and she has been travelling up and down to the mountains in [place], and has noticed left ear fullness , feels like it is under water, she cannot pop the ear. She states difficulty hearing if laying on left side. No pain. She has noticed some nasal congestion and some itchy nose. No symptoms of COVID-19 such as lack of taste or smell, fatigue, fever or chills. She had similar symptoms before and resolved with prednisone   Patient Active Problem List   Diagnosis Date Noted  . Familial hyperlipidemia 10/10/2017  . Pre-diabetes 08/18/2016  . Obesity 04/05/2016  . Hallux limitus 12/21/2015  . Migraine without aura, not intractable 12/23/2014  . Mild intermittent asthma 12/23/2014  . Controlled insomnia 12/23/2014  . Hyperlipidemia 12/23/2014  . Allergic rhinitis 12/23/2014  . History of shingles 12/23/2014  . History of postmenopausal bleeding 12/23/2014  . 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 BREAST BIOPSY Left   . CERVICAL POLYPECTOMY  07/26/12  . halux implant arthro Right 01/21/2016   Dr. Jacqualyn Posey  . TUBAL LIGATION      Family History  Problem Relation Age of Onset  . Hypertension Mother   . Dementia Mother   . Heart disease Mother   . Heart attack Father   . Heart attack Paternal Uncle   . Heart attack Paternal Grandfather   . Colitis Sister   . Diabetes Sister       Current Outpatient Medications:  .  albuterol (VENTOLIN HFA) 108 (90 Base) MCG/ACT inhaler, Inhale 2 puffs into the lungs every 6 (six) hours as needed for wheezing or shortness of breath., Disp: 18 g, Rfl: 0 .  cetirizine (ZYRTEC) 10 MG tablet, Take 10 mg by mouth daily., Disp: , Rfl:  .  ezetimibe (ZETIA) 10 MG tablet, Take 1 tablet (10 mg total) by mouth daily., Disp: 90 tablet, Rfl: 1 .  fluticasone (FLONASE) 50 MCG/ACT nasal spray, Place 1 spray into both nostrils daily. , Disp: , Rfl:  .  Fluticasone-Salmeterol (ADVAIR DISKUS) 250-50 MCG/DOSE AEPB, Inhale 1 puff into the lungs 2 (two) times daily., Disp: 60 each, Rfl: 2 .  Fremanezumab-vfrm (AJOVY) 225 MG/1.5ML SOSY, Inject 225 mg into the skin every 30 (thirty) days., Disp: 1 mL, Rfl: 2 .  montelukast (SINGULAIR) 10 MG tablet, Take 1 tablet (10 mg  total) by mouth at bedtime., Disp: 90 tablet, Rfl: 1 .  SUMAtriptan 6 MG/0.5ML SOAJ, INJECT UNDER THE SKIN DAILY AS NEEDED, NOT TO EXCEED 2 INJECTIONS IN 24 HOURS AS DIRECTED, Disp: 5 mL, Rfl: 0 .  valACYclovir (VALTREX) 1000 MG tablet, Take 1,000 mg by mouth. Eye doctor, Disp: , Rfl:  .  Vitamin D, Ergocalciferol, (DRISDOL) 1.25 MG (50000 UT) CAPS capsule, Take 1 capsule (50,000 Units total) by mouth every 7 (seven) days., Disp: 12 capsule, Rfl: 0 .  omega-3 acid ethyl esters (LOVAZA) 1 g capsule, Take 2 capsules (2 g total) by mouth 2 (two) times daily. (Patient not taking: Reported on 09/12/2019), Disp: 360 capsule, Rfl: 1  Allergies  Allergen  Reactions  . Penicillins Anaphylaxis and Hives  . Statins     Muscle ache  . Topamax [Topiramate]   . Tree Extract Other (See Comments)    Sneezing   . Victoza [Liraglutide]     Indigestion and worsening of migraines    I personally reviewed active problem list, medication list, allergies, family history, social history with the patient/caregiver today.   ROS  Ten systems reviewed and is negative except as mentioned in HPI   Objective  Virtual encounter, vitals not obtained.  Body mass index is 28.19 kg/m.  Physical Exam  Awake, alert and oriented  PHQ2/9: Depression screen [facility] 2/9 09/12/2019 05/31/2019 04/27/2018 10/10/2017 08/18/2016  Decreased Interest 0 0 0 1 0  Down, Depressed, Hopeless 0 0 0 1 0  PHQ - 2 Score 0 0 0 2 0  Altered sleeping 0 1 - 0 -  Tired, decreased energy 0 1 - 1 -  Change in appetite 0 1 - 1 -  Feeling bad or failure about yourself  0 0 - 0 -  Trouble concentrating 0 0 - 0 -  Moving slowly or fidgety/restless 0 0 - 0 -  Suicidal thoughts 0 0 - 0 -  PHQ-9 Score 0 3 - 4 -  Difficult doing work/chores Not difficult at all Not difficult at all - Not difficult at all -   PHQ-2/9 Result is negative.    Fall Risk: Fall Risk  09/12/2019 05/31/2019 04/27/2018 08/18/2016 04/05/2016  Falls in the past year? 0 0 No No No  Number falls in past yr: 0 0 - - -  Injury with Fall? 0 0 - - -     Assessment & Plan  1. Eustachian tube disorder, left  - predniSONE (STERAPRED UNI-PAK 21 TAB) 10 MG (21) TBPK tablet; Take as directed  Dispense: 21 tablet; Refill: 0 Discussed importance of taking with food, and also not right before bed, risk associated with prednisone use  I discussed the assessment and treatment plan with the patient. The patient was provided an opportunity to ask questions and all were answered. The patient agreed with the plan and demonstrated an understanding of the instructions.  The patient was advised to call back or seek an in-person  evaluation if the symptoms worsen or if the condition fails to improve as anticipated.  I provided 15 minutes of non-face-to-face time during this encounter.

## 2019-09-18 ENCOUNTER — Other Ambulatory Visit: Payer: Self-pay | Admitting: Family Medicine

## 2019-09-18 DIAGNOSIS — J4540 Moderate persistent asthma, uncomplicated: Secondary | ICD-10-CM

## 2019-09-18 NOTE — Telephone Encounter (Signed)
Requested medication (s) are due for refill today: yes  Requested medication (s) are on the active medication list: yes  Last refill:  05/31/2019  Future visit scheduled: yes  Notes to clinic:  pulmonology: Beta Agonists failed  Requested Prescriptions  Pending Prescriptions Disp Refills   albuterol (VENTOLIN HFA) 108 (90 Base) MCG/ACT inhaler [Pharmacy Med Name: ALBUTEROL HFA INH(200 PUFFS)18GM] 18 g 0    Sig: INHALE 2 PUFFS INTO THE LUNGS EVERY 6 HOURS AS NEEDED FOR WHEEZING OR SHORTNESS OF BREATH      Pulmonology:  Beta Agonists Failed - 09/18/2019  4:36 PM      Failed - One inhaler should last at least one month. If the patient is requesting refills earlier, contact the patient to check for uncontrolled symptoms.      Passed - Valid encounter within last 12 months    Recent Outpatient Visits           6 days ago Eustachian tube disorder, left   [facility] [place], Drue Stager, MD   3 months ago Migraine with aura and with status migrainosus, not intractable   [facility] [place], Drue Stager, MD   1 year ago Migraine with aura and with status migrainosus, not intractable   [facility] Steele Sizer, MD   1 year ago Pre-diabetes   [facility] [place], Drue Stager, MD   2 years ago Migraine without aura and without status migrainosus, not intractable   [facility] Steele Sizer, MD       Future Appointments             In 2 months Ancil Boozer, Drue Stager, MD [facility]

## 2019-09-22 ENCOUNTER — Other Ambulatory Visit: Payer: Self-pay | Admitting: Family Medicine

## 2019-09-22 DIAGNOSIS — G43101 Migraine with aura, not intractable, with status migrainosus: Secondary | ICD-10-CM

## 2019-09-28 ENCOUNTER — Other Ambulatory Visit: Payer: Self-pay | Admitting: Family Medicine

## 2019-09-28 DIAGNOSIS — E559 Vitamin D deficiency, unspecified: Secondary | ICD-10-CM

## 2019-09-28 NOTE — Telephone Encounter (Signed)
Requested medication (s) are due for refill today: no  Requested medication (s) are on the active medication list: yes  Last refill:  06/04/19 #12  Future visit scheduled: yes  Notes to clinic:  Refill not delegated per protocol    Requested Prescriptions  Pending Prescriptions Disp Refills   Vitamin D, Ergocalciferol, (DRISDOL) 1.25 MG (50000 UNIT) CAPS capsule [Pharmacy Med Name: VITAMIN D2 50,000IU (ERGO) CAP RX] 12 capsule 0    Sig: TAKE 1 CAPSULE BY MOUTH EVERY 7 DAYS      Endocrinology:  Vitamins - Vitamin D Supplementation Failed - 09/28/2019 10:21 AM      Failed - 50,000 IU strengths are not delegated      Failed - Phosphate in normal range and within 360 days    No results found for: PHOS        Failed - Vitamin D in normal range and within 360 days    Vit D, 25-Hydroxy  Date Value Ref Range Status  05/31/2019 15 (L) 30 - 100 ng/mL Final    Comment:    Vitamin D Status         25-OH Vitamin D: . Deficiency:                    <20 ng/mL Insufficiency:             20 - 29 ng/mL Optimal:                 > or = 30 ng/mL . For 25-OH Vitamin D testing on patients on  D2-supplementation and patients for whom quantitation  of D2 and D3 fractions is required, the [employer](TM) 25-OH VIT D, (D2,D3), LC/MS/MS is recommended: order  code [id number] (patients >33yrs). See Note 1 . Note 1 . For additional information, please refer to  http://education.[facility].com/faq/FAQ199  (This link is being provided for informational/ educational purposes only.)           Passed - Ca in normal range and within 360 days    Calcium  Date Value Ref Range Status  05/31/2019 9.0 8.6 - 10.4 mg/dL Final          Passed - Valid encounter within last 12 months    Recent Outpatient Visits           2 weeks ago Eustachian tube disorder, left   [facility] [place], Drue Stager, MD   4 months ago Migraine with aura and with status migrainosus, not intractable   [facility] [place], Drue Stager, MD   1 year ago Migraine with aura and with status migrainosus, not intractable   [facility] Steele Sizer, MD   1 year ago Pre-diabetes   [facility] [place], Drue Stager, MD   2 years ago Migraine without aura and without status migrainosus, not intractable   [facility] Steele Sizer, MD       Future Appointments             In 2 months Ancil Boozer, Drue Stager, MD [facility], [facility]

## 2019-11-14 ENCOUNTER — Other Ambulatory Visit: Payer: Self-pay | Admitting: Family Medicine

## 2019-11-28 ENCOUNTER — Ambulatory Visit: Payer: 59 | Admitting: Family Medicine

## 2019-12-17 ENCOUNTER — Other Ambulatory Visit: Payer: Self-pay | Admitting: Family Medicine

## 2019-12-17 DIAGNOSIS — J4540 Moderate persistent asthma, uncomplicated: Secondary | ICD-10-CM

## 2019-12-27 ENCOUNTER — Other Ambulatory Visit: Payer: Self-pay | Admitting: Family Medicine

## 2019-12-27 DIAGNOSIS — E7849 Other hyperlipidemia: Secondary | ICD-10-CM

## 2020-02-11 ENCOUNTER — Other Ambulatory Visit: Payer: Self-pay | Admitting: Family Medicine

## 2020-02-20 ENCOUNTER — Ambulatory Visit (INDEPENDENT_AMBULATORY_CARE_PROVIDER_SITE_OTHER): Payer: 59 | Admitting: Family Medicine

## 2020-02-20 ENCOUNTER — Other Ambulatory Visit: Payer: Self-pay

## 2020-02-20 ENCOUNTER — Encounter: Payer: Self-pay | Admitting: Family Medicine

## 2020-02-20 VITALS — BP 118/82 | HR 97 | Temp 98.1°F | Resp 16 | Ht 67.0 in | Wt 183.2 lb

## 2020-02-20 DIAGNOSIS — F341 Dysthymic disorder: Secondary | ICD-10-CM

## 2020-02-20 DIAGNOSIS — Z1211 Encounter for screening for malignant neoplasm of colon: Secondary | ICD-10-CM

## 2020-02-20 DIAGNOSIS — E538 Deficiency of other specified B group vitamins: Secondary | ICD-10-CM

## 2020-02-20 DIAGNOSIS — E7849 Other hyperlipidemia: Secondary | ICD-10-CM

## 2020-02-20 DIAGNOSIS — F411 Generalized anxiety disorder: Secondary | ICD-10-CM

## 2020-02-20 DIAGNOSIS — R7303 Prediabetes: Secondary | ICD-10-CM

## 2020-02-20 DIAGNOSIS — J4540 Moderate persistent asthma, uncomplicated: Secondary | ICD-10-CM | POA: Diagnosis not present

## 2020-02-20 DIAGNOSIS — G43009 Migraine without aura, not intractable, without status migrainosus: Secondary | ICD-10-CM | POA: Diagnosis not present

## 2020-02-20 DIAGNOSIS — Z79899 Other long term (current) drug therapy: Secondary | ICD-10-CM

## 2020-02-20 DIAGNOSIS — E559 Vitamin D deficiency, unspecified: Secondary | ICD-10-CM

## 2020-02-20 DIAGNOSIS — G4700 Insomnia, unspecified: Secondary | ICD-10-CM

## 2020-02-20 MED ORDER — VASCEPA 0.5 G PO CAPS: 4.0000 | ORAL_CAPSULE | Freq: Two times a day (BID) | ORAL | 1 refills | Status: DC

## 2020-02-20 MED ORDER — HYDROXYZINE HCL 10 MG PO TABS: 10.0000 mg | ORAL_TABLET | Freq: Three times a day (TID) | ORAL | 0 refills | Status: DC | PRN

## 2020-02-20 MED ORDER — DULOXETINE HCL 30 MG PO CPEP: 30.0000 mg | ORAL_CAPSULE | Freq: Every morning | ORAL | 0 refills | Status: DC

## 2020-02-20 MED ORDER — QUETIAPINE FUMARATE 25 MG PO TABS: 25.0000 mg | ORAL_TABLET | Freq: Every day | ORAL | 0 refills | Status: DC

## 2020-02-20 MED ORDER — SUMATRIPTAN SUCCINATE 6 MG/0.5ML ~~LOC~~ SOAJ: SUBCUTANEOUS | 2 refills | Status: DC
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
## 2020-04-09 ENCOUNTER — Ambulatory Visit: Payer: 59 | Admitting: Family Medicine

## 2020-07-25 ENCOUNTER — Other Ambulatory Visit: Payer: Self-pay | Admitting: Family Medicine

## 2020-07-25 DIAGNOSIS — Z1231 Encounter for screening mammogram for malignant neoplasm of breast: Secondary | ICD-10-CM

## 2020-08-14 ENCOUNTER — Other Ambulatory Visit: Payer: Self-pay | Admitting: Family Medicine

## 2020-08-14 DIAGNOSIS — E7849 Other hyperlipidemia: Secondary | ICD-10-CM

## 2020-08-14 DIAGNOSIS — J4540 Moderate persistent asthma, uncomplicated: Secondary | ICD-10-CM

## 2020-08-20 ENCOUNTER — Other Ambulatory Visit: Payer: Self-pay

## 2020-08-20 ENCOUNTER — Other Ambulatory Visit: Payer: Self-pay | Admitting: Family Medicine

## 2020-08-20 ENCOUNTER — Telehealth: Payer: Self-pay

## 2020-08-20 NOTE — Telephone Encounter (Signed)
Copied from [id number]. Topic: Quick Communication - See Telephone Encounter >> Aug 20, 2020 12:19 PM Loma Boston wrote: CRM for notification. See Telephone encounter for: 08/20/20. Pt states that Dr Ancil Boozer has told her if she ever has issues with her meds to call  and leave a message for the nurse. valACYclovir (VALTREX) 1000 MG tablet, she is having issues with. She states that she has current scripts on this from Dr Chauncey Cruel but is not  at home.The script is from 2017 so fowarding to Dr Chauncey Cruel nurse as pt request. [facility] [place], [place] - [address]  Phone:  [phone] Fax:  [phone] Pt has called pharmacy with no results

## 2020-08-21 MED ORDER — VALACYCLOVIR HCL 1 G PO TABS: 1000.0000 mg | ORAL_TABLET | Freq: Every day | ORAL | 0 refills | Status: DC

## 2020-08-21 NOTE — Telephone Encounter (Signed)
He won't prescribe unless a flare up- told her PCP needs to prescribe. Taking once daily.

## 2020-08-25 ENCOUNTER — Ambulatory Visit: Payer: Self-pay | Admitting: Family Medicine

## 2020-10-06 ENCOUNTER — Ambulatory Visit: Payer: Self-pay | Admitting: Family Medicine

## 2020-10-16 ENCOUNTER — Ambulatory Visit: Payer: Self-pay | Admitting: Family Medicine
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
## 2020-11-04 ENCOUNTER — Ambulatory Visit: Payer: Self-pay | Admitting: Family Medicine

## 2020-11-04 DIAGNOSIS — Z1211 Encounter for screening for malignant neoplasm of colon: Secondary | ICD-10-CM

## 2021-01-08 ENCOUNTER — Other Ambulatory Visit: Payer: Self-pay | Admitting: Family Medicine

## 2021-01-08 DIAGNOSIS — J4540 Moderate persistent asthma, uncomplicated: Secondary | ICD-10-CM

## 2021-01-08 DIAGNOSIS — E7849 Other hyperlipidemia: Secondary | ICD-10-CM

## 2021-01-08 NOTE — Telephone Encounter (Signed)
Requested medications are due for refill today yes  Requested medications are on the active medication list yes  Last refill 2/3  Last visit 02/2020  Future visit scheduled no  Notes to clinic Pt has canceled or no show for the last 5 appts. Failed protocol due to no valid visit within 6  months, no upcoming visit scheduled.

## 2021-01-09 NOTE — Telephone Encounter (Signed)
Last appt was 02/20/20, no furture appts made

## 2021-04-27 ENCOUNTER — Other Ambulatory Visit: Payer: Self-pay

## 2021-04-27 ENCOUNTER — Other Ambulatory Visit: Payer: Self-pay | Admitting: Family Medicine

## 2021-04-27 ENCOUNTER — Ambulatory Visit: Payer: BC Managed Care – PPO | Admitting: Family Medicine

## 2021-04-27 ENCOUNTER — Encounter: Payer: Self-pay | Admitting: Family Medicine

## 2021-04-27 VITALS — BP 120/72 | HR 95 | Temp 98.1°F | Resp 16 | Ht 67.0 in | Wt 192.0 lb

## 2021-04-27 DIAGNOSIS — F411 Generalized anxiety disorder: Secondary | ICD-10-CM

## 2021-04-27 DIAGNOSIS — E7849 Other hyperlipidemia: Secondary | ICD-10-CM

## 2021-04-27 DIAGNOSIS — Z23 Encounter for immunization: Secondary | ICD-10-CM | POA: Diagnosis not present

## 2021-04-27 DIAGNOSIS — Z1211 Encounter for screening for malignant neoplasm of colon: Secondary | ICD-10-CM

## 2021-04-27 DIAGNOSIS — E538 Deficiency of other specified B group vitamins: Secondary | ICD-10-CM | POA: Diagnosis not present

## 2021-04-27 DIAGNOSIS — Z79899 Other long term (current) drug therapy: Secondary | ICD-10-CM

## 2021-04-27 DIAGNOSIS — R7303 Prediabetes: Secondary | ICD-10-CM

## 2021-04-27 DIAGNOSIS — Z8619 Personal history of other infectious and parasitic diseases: Secondary | ICD-10-CM

## 2021-04-27 DIAGNOSIS — G43009 Migraine without aura, not intractable, without status migrainosus: Secondary | ICD-10-CM

## 2021-04-27 DIAGNOSIS — E559 Vitamin D deficiency, unspecified: Secondary | ICD-10-CM | POA: Diagnosis not present

## 2021-04-27 DIAGNOSIS — G4700 Insomnia, unspecified: Secondary | ICD-10-CM

## 2021-04-27 DIAGNOSIS — J4540 Moderate persistent asthma, uncomplicated: Secondary | ICD-10-CM

## 2021-04-27 MED ORDER — EZETIMIBE 10 MG PO TABS: 10.0000 mg | ORAL_TABLET | Freq: Every day | ORAL | 1 refills | Status: DC

## 2021-04-27 MED ORDER — MONTELUKAST SODIUM 10 MG PO TABS: ORAL_TABLET | ORAL | 1 refills | Status: DC

## 2021-04-27 MED ORDER — VALACYCLOVIR HCL 500 MG PO TABS: 500.0000 mg | ORAL_TABLET | Freq: Every day | ORAL | 1 refills | Status: DC

## 2021-04-27 MED ORDER — ALBUTEROL SULFATE HFA 108 (90 BASE) MCG/ACT IN AERS: 2.0000 | INHALATION_SPRAY | Freq: Four times a day (QID) | RESPIRATORY_TRACT | 0 refills | Status: DC | PRN

## 2021-04-27 MED ORDER — AJOVY 225 MG/1.5ML ~~LOC~~ SOSY: 225.0000 mg | PREFILLED_SYRINGE | SUBCUTANEOUS | 5 refills | Status: DC

## 2021-04-27 MED ORDER — FLUTICASONE FUROATE-VILANTEROL 100-25 MCG/INH IN AEPB: 1.0000 | INHALATION_SPRAY | Freq: Every day | RESPIRATORY_TRACT | 1 refills | Status: DC

## 2021-04-27 MED ORDER — SUMATRIPTAN SUCCINATE 6 MG/0.5ML ~~LOC~~ SOAJ: SUBCUTANEOUS | 2 refills | Status: DC

## 2021-04-27 NOTE — Progress Notes (Signed)
Name: Gail Ross   MRN: [id number]    DOB: [birth year]   Date:04/27/2021       Progress Note  Subjective  Chief Complaint  Medication Refill  HPI  Migraine headache: Migraine episodes are described as throbbing sensation usually right side of head, from nuchal area and radiates to frontal area, associated with photophobia, no phonophobia. She also has nausea, no vomiting.  She has been out of Ajovi and Imitirex for months, but has insurance again, less stress since she changed her job but still has episodes about 6 times per month    Ophthalmic Shingles: [facility] and taking Valtrex daily and needs a refill    Obesity: weight has been stable. She has a history of pre-diabetes , last A1C 5.6%   Dysthymia: she still misses her mother, she will have death anniversary Thanksgiving 2022    Familial hyperlipidemia: she was taking Crestor 40 mg since 03/2016 , but has noticed a lot muscle pain, spasms, we changed to 20 mg and added Co-Q 10 however leg pain got worse and she stopped in 2020, since last year taking Zetia and otc fish oil, but has been out of all her medications, we will resume zetia and return for labs in about 3 months   Asthma Moderate: she used to see Dr. Tami Ribas. She has been off Advair and has noticed more wheezing and cough, she would like to resume medications, but thinks once a daily will be better for compliance.    Vitamin D and B12 deficiency: reviewed last labs , continue supplementation , last levels still very low , we will recheck it next visit    Pre-diabetes: there is a family history of diabetes, she denies polyphagia, polydipsia or polyuria. 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 Jacqualyn Posey   TUBAL LIGATION      Family History  Problem Relation Age of Onset   Hypertension Mother    Dementia Mother    Heart disease Mother    Heart attack Father    Heart attack Paternal Uncle    Heart attack Paternal Grandfather    Colitis Sister    Diabetes Sister     Social History   Tobacco Use   Smoking status: Never   Smokeless tobacco: Never  Substance Use Topics   Alcohol use: Yes    Alcohol/week: 0.0 standard drinks    Comment: occasionally     Current Outpatient Medications:    ADVAIR DISKUS 250-50 MCG/DOSE AEPB, INHALE 1 PUFF INTO THE LUNGS TWICE DAILY, Disp: 60 each, Rfl: 2   albuterol (VENTOLIN HFA) 108 (90 Base) MCG/ACT inhaler, INHALE 2 PUFFS INTO THE LUNGS EVERY 6 HOURS AS NEEDED FOR WHEEZING OR SHORTNESS OF BREATH, Disp: 18 g, Rfl: 0   cetirizine (ZYRTEC) 10 MG tablet, Take 10 mg by mouth daily., Disp: ,  Rfl:    DULoxetine (CYMBALTA) 30 MG capsule, Take 1 capsule (30 mg total) by mouth in the morning. For the first week, after that 2 daily, Disp: 60 capsule, Rfl: 0   ezetimibe (ZETIA) 10 MG tablet, TAKE 1 TABLET(10 MG) BY MOUTH DAILY, Disp: 90 tablet, Rfl: 0   fluticasone (FLONASE) 50 MCG/ACT nasal spray, Place 1 spray into both nostrils daily. , Disp: , Rfl:    Fremanezumab-vfrm (AJOVY) 225 MG/1.5ML SOSY, Inject 225 mg into the skin every 30 (thirty) days., Disp: 1 mL, Rfl: 2   hydrOXYzine (ATARAX/VISTARIL) 10 MG tablet, Take 1 tablet (10 mg total) by mouth 3 (three) times daily as needed. May make you sleep, Disp: 30 tablet, Rfl: 0   montelukast (SINGULAIR) 10 MG tablet, TAKE 1 TABLET(10  MG) BY MOUTH AT BEDTIME, Disp: 90 tablet, Rfl: 0   QUEtiapine (SEROQUEL) 25 MG tablet, Take 1 tablet (25 mg total) by mouth at bedtime., Disp: 90 tablet, Rfl: 0   SUMAtriptan 6 MG/0.5ML SOAJ, INJECT UNDER THE SKIN DAILY AS NEEDED NOT TO EXCEED 2 INJECTIONS IN 2 HOURS, Disp: 5 mL, Rfl: 2   valACYclovir (VALTREX) 1000 MG tablet, Take 1 tablet (1,000 mg total) by mouth daily. Eye doctor, Disp: 90 tablet, Rfl: 0   valACYclovir (VALTREX) 500 MG tablet, Take 500 mg by mouth daily., Disp: , Rfl:    VASCEPA 0.5 g CAPS, Take 4 capsules by mouth 2 (two) times daily. Familial dyslipidemia, Disp: 360 capsule, Rfl: 1   Vitamin D, Ergocalciferol, (DRISDOL) 1.25 MG (50000 UNIT) CAPS capsule, TAKE 1 CAPSULE BY MOUTH EVERY 7 DAYS, Disp: 12 capsule, Rfl: 0  Allergies  Allergen Reactions   Penicillins Anaphylaxis and Hives   Statins     Muscle ache   Topamax [Topiramate]    Tree Extract Other (See Comments)    Sneezing    Victoza [Liraglutide]     Indigestion and worsening of migraines    I personally reviewed active problem list, medication list, allergies, family history, social history, health maintenance with the patient/caregiver today.   ROS  Constitutional: Negative for fever or weight change.  Respiratory: Negative for cough and shortness of breath.   Cardiovascular: Negative for chest pain or palpitations.  Gastrointestinal: Negative for abdominal pain, no bowel changes.  Musculoskeletal: Negative for gait problem or joint swelling.  Skin: Negative for rash.  Neurological: Negative for dizziness , positive for intermittent  headache.  No other specific complaints in a complete review of systems (except as listed in HPI above).   Objective  Vitals:   04/27/21 0914  BP: 120/72  Pulse: 95  Resp: 16  Temp: 98.1 F (36.7 C)  SpO2: 96%  Weight: 192 lb (87.1 kg)  Height: 5\' 7"  (1.702 m)    Body mass index is 30.07 kg/m.  Physical Exam  Constitutional: Patient appears  well-developed and well-nourished. Obese  No distress.  HEENT: head atraumatic, normocephalic, pupils equal and reactive to light,neck supple Cardiovascular: Normal rate, regular rhythm and normal heart sounds.  No murmur heard. No BLE edema. Pulmonary/Chest: Effort normal and breath sounds normal. No respiratory distress. Abdominal: Soft.  There is no tenderness. Psychiatric: Patient has a normal mood and affect. behavior is normal. Judgment and thought content normal.    PHQ2/9: Depression screen [facility] 2/9 04/27/2021 02/20/2020 09/12/2019 05/31/2019 04/27/2018  Decreased Interest 0 2 0 0 0  Down, Depressed, Hopeless 0 2 0 0 0  PHQ - 2 Score 0 4 0 0 0  Altered sleeping 1  2 0 1 -  Tired, decreased energy 0 2 0 1 -  Change in appetite 0 2 0 1 -  Feeling bad or failure about yourself  0 1 0 0 -  Trouble concentrating 0 1 0 0 -  Moving slowly or fidgety/restless 0 1 0 0 -  Suicidal thoughts 0 0 0 0 -  PHQ-9 Score 1 13 0 3 -  Difficult doing work/chores - Very difficult Not difficult at all Not difficult at all -    phq 9 is negative   Fall Risk: Fall Risk  04/27/2021 02/20/2020 09/12/2019 05/31/2019 04/27/2018  Falls in the past year? 0 0 0 0 No  Number falls in past yr: 0 0 0 0 -  Injury with Fall? 0 0 0 0 -  Risk for fall due to : No Fall Risks - - - -  Follow up Falls prevention discussed - - - -      Functional Status Survey: Is the patient deaf or have difficulty hearing?: No Does the patient have difficulty seeing, even when wearing glasses/contacts?: No Does the patient have difficulty concentrating, remembering, or making decisions?: No Does the patient have difficulty walking or climbing stairs?: No Does the patient have difficulty dressing or bathing?: No Does the patient have difficulty doing errands alone such as visiting a doctor's office or shopping?: No    Assessment & Plan  1. Migraine without aura and without status migrainosus, not intractable  - SUMAtriptan  6 MG/0.5ML SOAJ; INJECT UNDER THE SKIN DAILY AS NEEDED NOT TO EXCEED 2 INJECTIONS IN 2 HOURS  Dispense: 5 mL; Refill: 2 - Fremanezumab-vfrm (AJOVY) 225 MG/1.5ML SOSY; Inject 225 mg into the skin every 30 (thirty) days.  Dispense: 1 mL; Refill: 5  2. Vitamin D deficiency   3. Vitamin B12 deficiency   4. Influenza vaccine needed  - Flu Vaccine QUAD 6+ mos PF IM (Fluarix Quad PF)  5. Moderate persistent asthma without complication  - fluticasone furoate-vilanterol (BREO ELLIPTA) 100-25 MCG/INH AEPB; Inhale 1 puff into the lungs daily.  Dispense: 180 each; Refill: 1 - albuterol (VENTOLIN HFA) 108 (90 Base) MCG/ACT inhaler; Inhale 2 puffs into the lungs every 6 (six) hours as needed for wheezing or shortness of breath.  Dispense: 18 g; Refill: 0 - montelukast (SINGULAIR) 10 MG tablet; TAKE 1 TABLET(10 MG) BY MOUTH AT BEDTIME  Dispense: 90 tablet; Refill: 1  6. Pre-diabetes   7. GAD (generalized anxiety disorder)   8. Insomnia, unspecified type   9. Familial hyperlipidemia  - ezetimibe (ZETIA) 10 MG tablet; Take 1 tablet (10 mg total) by mouth daily.  Dispense: 90 tablet; Refill: 1  10. Colon cancer screening  - Cologuard  11. History of shingles  - valACYclovir (VALTREX) 500 MG tablet; Take 1 tablet (500 mg total) by mouth daily.  Dispense: 90 tablet; Refill: 1

## 2021-04-27 NOTE — Addendum Note (Signed)
Addended by: Steele Sizer F on: 04/27/2021 10:15 AM   Modules accepted: Orders

## 2021-04-29 ENCOUNTER — Telehealth: Payer: Self-pay | Admitting: Family Medicine

## 2021-04-29 NOTE — Telephone Encounter (Signed)
Pt called to report that she needs alternative options for her medication, she says they are current unaffordable for her and she wants to speak to the clinic and discuss more affordable options.    Best contact: [phone]

## 2021-04-30 ENCOUNTER — Other Ambulatory Visit: Payer: Self-pay | Admitting: Family Medicine

## 2021-04-30 DIAGNOSIS — J4540 Moderate persistent asthma, uncomplicated: Secondary | ICD-10-CM

## 2021-04-30 MED ORDER — PROAIR DIGIHALER 108 (90 BASE) MCG/ACT IN AEPB: 2.0000 | INHALATION_SPRAY | Freq: Four times a day (QID) | RESPIRATORY_TRACT | 1 refills | Status: DC | PRN

## 2021-04-30 MED ORDER — AIRDUO DIGIHALER 232-14 MCG/ACT IN AEPB: 1.0000 | INHALATION_SPRAY | Freq: Two times a day (BID) | RESPIRATORY_TRACT | 2 refills | Status: DC

## 2021-05-02 ENCOUNTER — Other Ambulatory Visit: Payer: Self-pay | Admitting: Family Medicine

## 2021-05-02 DIAGNOSIS — G43009 Migraine without aura, not intractable, without status migrainosus: Secondary | ICD-10-CM

## 2021-05-03 NOTE — Telephone Encounter (Signed)
last RF 04/27/21 # 5 ml with 2 RF

## 2021-05-30 ENCOUNTER — Other Ambulatory Visit: Payer: Self-pay | Admitting: Family Medicine

## 2021-05-30 DIAGNOSIS — J4540 Moderate persistent asthma, uncomplicated: Secondary | ICD-10-CM

## 2021-05-30 NOTE — Telephone Encounter (Signed)
last RF 04/27/21 #90 1 RF requested too soon

## 2021-06-01 ENCOUNTER — Other Ambulatory Visit: Payer: Self-pay | Admitting: Family Medicine

## 2021-06-01 DIAGNOSIS — J4540 Moderate persistent asthma, uncomplicated: Secondary | ICD-10-CM

## 2021-06-23 ENCOUNTER — Ambulatory Visit: Payer: Self-pay

## 2021-06-23 NOTE — Telephone Encounter (Signed)
°  Chief Complaint: sinus congestion Symptoms: stuffy nose, cough, congestion, L earache Frequency: 1 week Pertinent Negatives: Patient denies fever Disposition: [] ED /[] Urgent Care (no appt availability in office) / [x] Appointment(In office/virtual)/ []  [facility]/ [] Home Care/ [] Refused Recommended Disposition  Additional Notes: Pt is asking if Prednisone could be called in but advised she needed an appt after speaking with FC. She was scheduled for VV 06/24/21 at 1300. Advised pt to call if needed to cancel/reschedule. Pt verbalized understanding.    Reason for Disposition  Earache  Answer Assessment - Initial Assessment Questions 1. LOCATION: "Where does it hurt?"      L ear, nose 2. ONSET: "When did the sinus pain start?"  (e.g., hours, days)      1 Week  3. SEVERITY: "How bad is the pain?"   (Scale 1-10; mild, moderate or severe)   - MILD (1-3): doesn't interfere with normal activities    - MODERATE (4-7): interferes with normal activities (e.g., work or school) or awakens from sleep   - SEVERE (8-10): excruciating pain and patient unable to do any normal activities        moderate 4. RECURRENT SYMPTOM: "Have you ever had sinus problems before?" If Yes, ask: "When was the last time?" and "What happened that time?"      Yes every year 5. NASAL CONGESTION: "Is the nose blocked?" If Yes, ask: "Can you open it or must you breathe through your mouth?"     yes 6. NASAL DISCHARGE: "Do you have discharge from your nose?" If so ask, "What color?"     greenish 7. FEVER: "Do you have a fever?" If Yes, ask: "What is it, how was it measured, and when did it start?"      No 8. OTHER SYMPTOMS: "Do you have any other symptoms?" (e.g., sore throat, cough, earache, difficulty breathing)     Stuffy nose, cough, L earache  Protocols used: Sinus Pain or Congestion-A-AH

## 2021-06-23 NOTE — Telephone Encounter (Signed)
Patient called, left VM to return the call to the office to discuss symptoms with a nurse.   Message from Jodie Echevaria sent at 06/23/2021  1:14 PM EST  Patient called in to inquire of Dr Ancil Boozer about possibly getting an Rx sent in for Prednisone for a sinus infection. Per patient Dr Ancil Boozer would send the Rx in but she wanted to speak to a nurse before Can be reached at Ph# [phone]

## 2021-06-23 NOTE — Telephone Encounter (Signed)
Answer Assessment - Initial Assessment Questions 1. LOCATION: "Where does it hurt?"      L ear, nose 2. ONSET: "When did the sinus pain start?"  (e.g., hours, days)      1 Week  3. SEVERITY: "How bad is the pain?"   (Scale 1-10; mild, moderate or severe)   - MILD (1-3): doesn't interfere with normal activities    - MODERATE (4-7): interferes with normal activities (e.g., work or school) or awakens from sleep   - SEVERE (8-10): excruciating pain and patient unable to do any normal activities        moderate 4. RECURRENT SYMPTOM: "Have you ever had sinus problems before?" If Yes, ask: "When was the last time?" and "What happened that time?"      Yes every year 5. NASAL CONGESTION: "Is the nose blocked?" If Yes, ask: "Can you open it or must you breathe through your mouth?"     yes 6. NASAL DISCHARGE: "Do you have discharge from your nose?" If so ask, "What color?"     greenish 7. FEVER: "Do you have a fever?" If Yes, ask: "What is it, how was it measured, and when did it start?"      No 8. OTHER SYMPTOMS: "Do you have any other symptoms?" (e.g., sore throat, cough, earache, difficulty breathing)     Stuffy nose, cough, L earache  Protocols used: Sinus Pain or Congestion-A-AH

## 2021-06-24 ENCOUNTER — Encounter: Payer: Self-pay | Admitting: Nurse Practitioner

## 2021-06-24 ENCOUNTER — Other Ambulatory Visit: Payer: Self-pay

## 2021-06-24 ENCOUNTER — Telehealth (INDEPENDENT_AMBULATORY_CARE_PROVIDER_SITE_OTHER): Payer: BC Managed Care – PPO | Admitting: Nurse Practitioner

## 2021-06-24 DIAGNOSIS — J0141 Acute recurrent pansinusitis: Secondary | ICD-10-CM | POA: Diagnosis not present

## 2021-06-24 MED ORDER — LEVOFLOXACIN 750 MG PO TABS: 750.0000 mg | ORAL_TABLET | Freq: Every day | ORAL | 0 refills | Status: AC

## 2021-06-24 NOTE — Progress Notes (Signed)
Name: Gail Ross   MRN: [id number]    DOB: [birth year]   Date:06/24/2021       Progress Note  Subjective  Chief Complaint  Chief Complaint  Patient presents with   Sinusitis    Nasal congested, ear pain, facial pressure, cough    I connected with  [name]  on 06/24/21 at 0900 am by a video enabled telemedicine application and verified that I am speaking with the correct person using two identifiers.  I discussed the limitations of evaluation and management by telemedicine and the availability of in person appointments. The patient expressed understanding and agreed to proceed with a virtual visit  Staff also discussed with the patient that there may be a patient responsible charge related to this service. Patient Location: home Provider Location: [facility] Additional Individuals present: alone  HPI  Sinusitis:  She says she has had sinus congestion for ten days.  She says she has facial tenderness and left ear pain.  She says she has been taking her allergy medication and other OTC treatments for symptoms.  She says that when it lasts this long she needs antibiotics.  She says this happens every year. She denies any cough or fever. She denies any ill contacts.  She says that she has seen ENT in the past for this.   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    Social History   Tobacco Use   Smoking status: Never   Smokeless tobacco: Never  Substance Use Topics   Alcohol use: Yes    Alcohol/week: 0.0 standard drinks    Comment: occasionally      Current Outpatient Medications:    Albuterol Sulfate, sensor, (PROAIR DIGIHALER) 108 (90 Base) MCG/ACT AEPB, Inhale 2 puffs into the lungs 4 (four) times daily as needed., Disp: 1 each, Rfl: 1   cetirizine (ZYRTEC) 10 MG tablet, Take 10 mg by mouth daily., Disp: , Rfl:    cholecalciferol (VITAMIN D3) 25 MCG (1000 UNIT) tablet, Take 1,000 Units by mouth daily., Disp: , Rfl:    ezetimibe (ZETIA) 10 MG tablet, Take 1 tablet (10 mg total) by mouth daily., Disp: 90 tablet, Rfl: 1   fluticasone (FLONASE) 50 MCG/ACT nasal spray, Place 1 spray into both nostrils daily. , Disp: , Rfl:    Fluticasone-Salmeterol,sensor, (AIRDUO DIGIHALER) 232-14 MCG/ACT AEPB, Inhale 1 puff into the lungs 2 (two) times daily., Disp: 1 each, Rfl: 2   Fremanezumab-vfrm (AJOVY) 225 MG/1.5ML SOSY, Inject 225 mg into the skin every 30 (thirty) days., Disp: 1 mL, Rfl: 5   montelukast (SINGULAIR) 10 MG tablet, TAKE 1 TABLET(10 MG) BY MOUTH AT BEDTIME, Disp: 90 tablet, Rfl: 1   SUMAtriptan 6 MG/0.5ML SOAJ, INJECT UNDER THE SKIN DAILY AS NEEDED NOT TO EXCEED 2 INJECTIONS IN 2 HOURS, Disp: 5 mL, Rfl: 2   valACYclovir (VALTREX) 500 MG tablet, Take 1 tablet (500 mg total) by mouth daily., Disp: 90 tablet, Rfl: 1  Allergies  Allergen Reactions   Penicillins Anaphylaxis and Hives   Statins     Muscle ache   Topamax [Topiramate]  Tree Extract Other (See Comments)    Sneezing    Victoza [Liraglutide]     Indigestion and worsening of migraines    I personally reviewed active problem list, medication list, allergies with the patient/caregiver today.  ROS  Constitutional: Negative for fever or weight change.  HEENT: positive for facial tenderness, nasal congestion and left ear pain Respiratory: Negative for cough and shortness of breath.   Cardiovascular: Negative for chest pain or palpitations.  Gastrointestinal: Negative for abdominal pain, no bowel changes.  Musculoskeletal: Negative for gait problem or joint  swelling.  Skin: Negative for rash.  Neurological: Negative for dizziness or headache.  No other specific complaints in a complete review of systems (except as listed in HPI above).   Objective  Virtual encounter, vitals not obtained.  There is no height or weight on file to calculate BMI.  Nursing Note and Vital Signs reviewed.  Physical Exam  Awake, alert and oriented, speaking in complete sentences  No results found for this or any previous visit (from the past 72 hour(s)).  Assessment & Plan    -Red flags and when to present for emergency care or RTC including fever >101.59F, chest pain, shortness of breath, new/worsening/un-resolving symptoms, reviewed with patient at time of visit. Follow up and care instructions discussed and provided in AVS. - I discussed the assessment and treatment plan with the patient. The patient was provided an opportunity to ask questions and all were answered. The patient agreed with the plan and demonstrated an understanding of the instructions.  I provided 15 minutes of non-face-to-face time during this encounter.  Bo Merino, FNP

## 2021-07-02 ENCOUNTER — Other Ambulatory Visit: Payer: Self-pay | Admitting: Nurse Practitioner

## 2021-07-02 DIAGNOSIS — J0141 Acute recurrent pansinusitis: Secondary | ICD-10-CM

## 2021-07-02 NOTE — Telephone Encounter (Signed)
Requested medication (s) are due for refill today: no Completed course  Requested medication (s) are on the active medication list: yes    Last refill: 06/24/21  #10  0 refills  Future visit scheduled yes 10/27/21  Notes to clinic:Off protocol, sent via Interface On current med profile.   Requested Prescriptions  Pending Prescriptions Disp Refills   levofloxacin (LEVAQUIN) 750 MG tablet [Pharmacy Med Name: LEVOFLOXACIN 750MG  TABLETS] 10 tablet 0    Sig: TAKE 1 TABLET(750 MG) BY MOUTH DAILY FOR 10 DAYS     Off-Protocol Failed - 07/02/2021  9:51 AM      Failed - Medication not assigned to a protocol, review manually.      Passed - Valid encounter within last 12 months    Recent Outpatient Visits           1 week ago Acute recurrent pansinusitis   [facility] Serafina Royals F, FNP   2 months ago Migraine without aura and without status migrainosus, not intractable   [facility] Steele Sizer, MD   1 year ago Vitamin D deficiency   [facility] Steele Sizer, MD   1 year ago Eustachian tube disorder, left   [facility] [place], Drue Stager, MD   2 years ago Migraine with aura and with status migrainosus, not intractable   [facility] Steele Sizer, MD       Future Appointments             In 3 months Ancil Boozer, Drue Stager, MD [facility], [facility]

## 2021-07-22 NOTE — Progress Notes (Signed)
Name: Gail Ross   MRN: [id number]    DOB: 1960/12/11   Date:07/23/2021       Progress Note  Subjective  Chief Complaint  Sinusitis  I connected with  [name]  on 07/23/21 at  9:00 AM EST by a video enabled telemedicine application and verified that I am speaking with the correct person using two identifiers.  I discussed the limitations of evaluation and management by telemedicine and the availability of in person appointments. The patient expressed understanding and agreed to proceed with the virtual visit  Staff also discussed with the patient that there may be a patient responsible charge related to this service. Patient Location: at home  Provider Location: [facility] Additional Individuals present: alone   HPI  She has not been feeling well for about 5-6 weeks, she was seen by Serafina Royals, NP one month ago and treated for pansinusitis with Levaquin. She states she ran out of Breo about 4 days ago and is feeling much worse again, now she has been waking up with wheezing, has a dry cough, still having facial pressure and both ears feels full and sensation of being under water. She still has rescue inhaler but [facility] is too costly and needs an alternative. She has been using Flonase daily . She denies fever or chills. Discussed asthma exacerbation, cheaper alternative for asthma inhaler and to contact us if [facility] is not covered. We will try a round of steroids but to let us know if asthma not improving and to go to 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 Jacqualyn Posey   TUBAL LIGATION      Family History  Problem Relation Age of Onset   Hypertension Mother    Dementia Mother    Heart disease Mother    Heart attack Father    Heart attack Paternal Uncle    Heart attack Paternal Grandfather    Colitis Sister    Diabetes Sister     Social History   Socioeconomic History   Marital status: Divorced    Spouse name: Not on file   Number of children: 0   Years of education: Not on file   Highest education level: Some college, no degree  Occupational History   Occupation: Press photographer    Comment: [name]  Tobacco Use   Smoking status: Never   Smokeless tobacco: Never  Vaping Use   Vaping Use: Never used  Substance and Sexual Activity   Alcohol use: Yes    Alcohol/week: 0.0 standard drinks    Comment: occasionally   Drug use: No   Sexual activity: Not Currently  Other Topics Concern   Not on file  Social History Narrative   Moved to [place] because of new job, but has to commute to [employer].    Lives alone.    Married but not together- never got a  divorce   Social Determinants of Health   Financial Resource Strain: Not on file  Food Insecurity: Not on file  Transportation Needs: Not on file  Physical Activity: Not on file  Stress: Not on file  Social Connections: Not on file  Intimate Partner Violence: Not on file     Current Outpatient Medications:    Albuterol Sulfate, sensor, (PROAIR DIGIHALER) 108 (90 Base) MCG/ACT AEPB, Inhale 2 puffs into the lungs 4 (four) times daily as needed., Disp: 1 each, Rfl: 1   cetirizine (ZYRTEC) 10 MG tablet, Take 10 mg by mouth daily., Disp: , Rfl:    cholecalciferol (VITAMIN D3) 25 MCG (1000 UNIT) tablet, Take 1,000 Units by mouth daily., Disp: , Rfl:    ezetimibe (ZETIA) 10 MG tablet, Take 1  tablet (10 mg total) by mouth daily., Disp: 90 tablet, Rfl: 1   fluticasone (FLONASE) 50 MCG/ACT nasal spray, Place 1 spray into both nostrils daily. , Disp: , Rfl:    Fluticasone-Salmeterol,sensor, (AIRDUO DIGIHALER) 232-14 MCG/ACT AEPB, Inhale 1 puff into the lungs 2 (two) times daily., Disp: 1 each, Rfl: 2   Fremanezumab-vfrm (AJOVY) 225 MG/1.5ML SOSY, Inject 225 mg into the skin every 30 (thirty) days., Disp: 1 mL, Rfl: 5   montelukast (SINGULAIR) 10 MG tablet, TAKE 1 TABLET(10 MG) BY MOUTH AT BEDTIME, Disp: 90 tablet, Rfl: 1   SUMAtriptan 6 MG/0.5ML SOAJ, INJECT UNDER THE SKIN DAILY AS NEEDED NOT TO EXCEED 2 INJECTIONS IN 2 HOURS, Disp: 5 mL, Rfl: 2   valACYclovir (VALTREX) 500 MG tablet, Take 1 tablet (500 mg total) by mouth daily., Disp: 90 tablet, Rfl: 1  Allergies  Allergen Reactions   Penicillins Anaphylaxis and Hives   Statins     Muscle ache   Topamax [Topiramate]    Tree Extract Other (See Comments)    Sneezing    Victoza [Liraglutide]     Indigestion and worsening of migraines    I personally reviewed active problem list, medication list, allergies, family history, social history, health maintenance with the patient/caregiver today.   ROS  Ten systems reviewed and is negative except as mentioned in HPI   Objective  Virtual encounter, vitals not obtained.  There is no height or weight on file to calculate BMI.  Physical Exam  Awake, alert and oriented , coughing during the visit   PHQ2/9: Depression screen [facility] 2/9 07/23/2021 06/24/2021 04/27/2021 02/20/2020 09/12/2019  Decreased Interest 0 0 0 2 0  Down, Depressed, Hopeless 0 0 0 2 0  PHQ - 2 Score 0 0 0 4 0  Altered sleeping 0 - 1 2 0  Tired, decreased energy 0 - 0 2 0  Change in appetite 0 - 0 2 0  Feeling bad or failure about yourself  0 - 0 1 0  Trouble concentrating 0 - 0 1 0  Moving slowly or fidgety/restless 0 - 0 1 0  Suicidal thoughts 0 - 0 0 0  PHQ-9 Score 0 - 1 13 0  Difficult doing work/chores -  - - Very difficult Not difficult at all   PHQ-2/9 Result is negative.    Fall Risk: Fall Risk  07/23/2021 06/24/2021 04/27/2021 02/20/2020 09/12/2019  Falls in the past year? 0 0 0 0 0  Number falls in past yr: 0 0 0 0 0  Injury with Fall? 0 0 0 0 0  Risk for fall due to : No Fall Risks - No Fall Risks - -  Follow up Falls prevention discussed - Falls  prevention discussed - -     Assessment & Plan  1. Moderate persistent asthma without complication  - mometasone-formoterol (DULERA) 200-5 MCG/ACT AERO; Inhale 2 puffs into the lungs 2 (two) times daily.  Dispense: 13 g; Refill: 2 - methylPREDNISolone (MEDROL DOSEPAK) 4 MG TBPK tablet; Take by mouth daily at 12 noon. Take as directed  Dispense: 21 tablet; Refill: 0  2. Chronic sinusitis of both maxillary sinuses   3. Eustachian tube dysfunction, bilateral  - azelastine (ASTELIN) 0.1 % nasal spray; Place 2 sprays into both nostrils 2 (two) times daily. Use in each nostril as directed  Dispense: 30 mL; Refill: 2   I discussed the assessment and treatment plan with the patient. The patient was provided an opportunity to ask questions and all were answered. The patient agreed with the plan and demonstrated an understanding of the instructions.  The patient was advised to call back or seek an in-person evaluation if the symptoms worsen or if the condition fails to improve as anticipated.  I provided 25  minutes of non-face-to-face time during this encounter.

## 2021-07-23 ENCOUNTER — Telehealth (INDEPENDENT_AMBULATORY_CARE_PROVIDER_SITE_OTHER): Payer: BC Managed Care – PPO | Admitting: Family Medicine

## 2021-07-23 ENCOUNTER — Encounter: Payer: Self-pay | Admitting: Family Medicine

## 2021-07-23 DIAGNOSIS — J4540 Moderate persistent asthma, uncomplicated: Secondary | ICD-10-CM | POA: Diagnosis not present

## 2021-07-23 DIAGNOSIS — J320 Chronic maxillary sinusitis: Secondary | ICD-10-CM | POA: Diagnosis not present

## 2021-07-23 DIAGNOSIS — H6983 Other specified disorders of Eustachian tube, bilateral: Secondary | ICD-10-CM

## 2021-07-23 MED ORDER — MOMETASONE FURO-FORMOTEROL FUM 200-5 MCG/ACT IN AERO
2.0000 | INHALATION_SPRAY | Freq: Two times a day (BID) | RESPIRATORY_TRACT | 2 refills | Status: DC
Start: 2021-07-23 — End: 2021-11-13

## 2021-07-23 MED ORDER — METHYLPREDNISOLONE 4 MG PO TBPK: ORAL_TABLET | Freq: Every day | ORAL | 0 refills | Status: DC

## 2021-07-23 MED ORDER — AZELASTINE HCL 0.1 % NA SOLN: 2.0000 | Freq: Two times a day (BID) | NASAL | 2 refills | Status: DC

## 2021-07-31 ENCOUNTER — Other Ambulatory Visit: Payer: Self-pay | Admitting: Family Medicine

## 2021-07-31 DIAGNOSIS — J4540 Moderate persistent asthma, uncomplicated: Secondary | ICD-10-CM

## 2021-08-20 ENCOUNTER — Other Ambulatory Visit: Payer: Self-pay | Admitting: Family Medicine

## 2021-08-20 DIAGNOSIS — G43009 Migraine without aura, not intractable, without status migrainosus: Secondary | ICD-10-CM

## 2021-10-27 ENCOUNTER — Ambulatory Visit: Payer: BC Managed Care – PPO | Admitting: Family Medicine

## 2021-11-05 ENCOUNTER — Other Ambulatory Visit: Payer: Self-pay | Admitting: Family Medicine

## 2021-11-05 DIAGNOSIS — G43009 Migraine without aura, not intractable, without status migrainosus: Secondary | ICD-10-CM

## 2021-11-05 NOTE — Telephone Encounter (Signed)
Appt scheduled for May 11 with Dr Ancil Boozer

## 2021-11-06 ENCOUNTER — Other Ambulatory Visit: Payer: Self-pay | Admitting: Family Medicine

## 2021-11-06 DIAGNOSIS — G43009 Migraine without aura, not intractable, without status migrainosus: Secondary | ICD-10-CM

## 2021-11-10 ENCOUNTER — Other Ambulatory Visit: Payer: Self-pay | Admitting: Family Medicine

## 2021-11-10 DIAGNOSIS — Z8619 Personal history of other infectious and parasitic diseases: Secondary | ICD-10-CM

## 2021-11-11 ENCOUNTER — Other Ambulatory Visit: Payer: Self-pay | Admitting: Family Medicine

## 2021-11-11 ENCOUNTER — Other Ambulatory Visit: Payer: Self-pay

## 2021-11-11 DIAGNOSIS — G43009 Migraine without aura, not intractable, without status migrainosus: Secondary | ICD-10-CM

## 2021-11-11 MED ORDER — SUMATRIPTAN SUCCINATE 6 MG/0.5ML ~~LOC~~ SOAJ: SUBCUTANEOUS | 0 refills | Status: DC

## 2021-11-13 ENCOUNTER — Telehealth: Payer: Self-pay | Admitting: Family Medicine

## 2021-11-13 DIAGNOSIS — J4540 Moderate persistent asthma, uncomplicated: Secondary | ICD-10-CM

## 2021-11-13 NOTE — Telephone Encounter (Signed)
Her appt was on 11/19/2021 and she cancelled due to being out of town for work She will schdedule when she gets back into town ?

## 2021-11-16 ENCOUNTER — Ambulatory Visit: Payer: BC Managed Care – PPO | Admitting: Family Medicine

## 2021-11-19 ENCOUNTER — Ambulatory Visit: Payer: BC Managed Care – PPO | Admitting: Family Medicine

## 2021-11-27 ENCOUNTER — Encounter: Payer: Self-pay | Admitting: Family Medicine

## 2021-11-30 ENCOUNTER — Encounter: Payer: Self-pay | Admitting: Family Medicine

## 2021-11-30 ENCOUNTER — Telehealth (INDEPENDENT_AMBULATORY_CARE_PROVIDER_SITE_OTHER): Payer: BC Managed Care – PPO | Admitting: Family Medicine

## 2021-11-30 VITALS — Ht 67.0 in | Wt 192.0 lb

## 2021-11-30 DIAGNOSIS — J3089 Other allergic rhinitis: Secondary | ICD-10-CM

## 2021-11-30 DIAGNOSIS — J302 Other seasonal allergic rhinitis: Secondary | ICD-10-CM

## 2021-11-30 DIAGNOSIS — Z1211 Encounter for screening for malignant neoplasm of colon: Secondary | ICD-10-CM | POA: Diagnosis not present

## 2021-11-30 DIAGNOSIS — H6983 Other specified disorders of Eustachian tube, bilateral: Secondary | ICD-10-CM

## 2021-11-30 DIAGNOSIS — E7849 Other hyperlipidemia: Secondary | ICD-10-CM | POA: Diagnosis not present

## 2021-11-30 DIAGNOSIS — J4540 Moderate persistent asthma, uncomplicated: Secondary | ICD-10-CM | POA: Diagnosis not present

## 2021-11-30 DIAGNOSIS — G43009 Migraine without aura, not intractable, without status migrainosus: Secondary | ICD-10-CM | POA: Diagnosis not present

## 2021-11-30 DIAGNOSIS — Z1231 Encounter for screening mammogram for malignant neoplasm of breast: Secondary | ICD-10-CM

## 2021-11-30 DIAGNOSIS — H6993 Unspecified Eustachian tube disorder, bilateral: Secondary | ICD-10-CM

## 2021-11-30 MED ORDER — PROAIR DIGIHALER 108 (90 BASE) MCG/ACT IN AEPB: 2.0000 | INHALATION_SPRAY | Freq: Four times a day (QID) | RESPIRATORY_TRACT | 1 refills | Status: DC | PRN

## 2021-11-30 MED ORDER — AZELASTINE HCL 0.1 % NA SOLN: 2.0000 | Freq: Two times a day (BID) | NASAL | 2 refills | Status: DC

## 2021-11-30 MED ORDER — TRELEGY ELLIPTA 100-62.5-25 MCG/ACT IN AEPB: 1.0000 | INHALATION_SPRAY | Freq: Every day | RESPIRATORY_TRACT | 5 refills | Status: DC

## 2021-11-30 MED ORDER — SUMATRIPTAN SUCCINATE 6 MG/0.5ML ~~LOC~~ SOAJ: SUBCUTANEOUS | 0 refills | Status: DC

## 2021-11-30 MED ORDER — FLUTICASONE PROPIONATE 50 MCG/ACT NA SUSP: 1.0000 | Freq: Every day | NASAL | 0 refills | Status: DC

## 2021-11-30 MED ORDER — UBRELVY 100 MG PO TABS: 1.0000 | ORAL_TABLET | ORAL | 5 refills | Status: DC

## 2021-11-30 MED ORDER — MONTELUKAST SODIUM 10 MG PO TABS: ORAL_TABLET | ORAL | 1 refills | Status: DC

## 2021-11-30 NOTE — Progress Notes (Signed)
Name: [name]   MRN: [id number]    DOB: 1960/10/27   Date:11/30/2021       Progress Note  Subjective  Chief Complaint  Allergy Referral  I connected with  Gail Ross  on 11/30/21 at  8:20 AM EDT by a video enabled telemedicine application and verified that I am speaking with the correct person using two identifiers.  I discussed the limitations of evaluation and management by telemedicine and the availability of in person appointments. The patient expressed understanding and agreed to proceed with the virtual visit  Staff also discussed with the patient that there may be a patient responsible charge related to this service. Patient Location: at work Provider Location: [facility] Additional Individuals present: alone   HPI  Migraine headache: Migraine episodes are described as throbbing sensation usually right side of head, from nuchal area and radiates to frontal area, associated with photophobia, no phonophobia. She also has nausea, no vomiting.  She has been out of Ajovi due to cost, we will try sending Ubrevly to see if discount card will work. She had 8 episodes of migraine so far this mont   Familial hyperlipidemia: she was taking Crestor 40 mg since 03/2016 , but has noticed a lot muscle pain, spasms, we changed to 20 mg and added Co-Q 10 however leg pain got worse and she stopped in 2020, since last year taking Zetia and otc fish oil, but has been out of all her medications, unable to afford Zetia.    Asthma Moderate: she used to see Dr. Tami Ribas. She has been on [facility] and not responding. States not able to sleep due to wheezing, she did better on Advair but not covered under her insurance, we will try Trelegy, we will give her samples to start    Vitamin D and B12 deficiency: reviewed last labs , continue supplementation    Pre-diabetes: there is a family history of diabetes, she denies polyphagia, polydipsia or polyuria. 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 Jacqualyn Posey   TUBAL LIGATION      Family History  Problem Relation Age of Onset   Hypertension Mother    Dementia Mother    Heart disease Mother    Heart attack Father    Heart attack Paternal Uncle    Heart attack Paternal Grandfather    Colitis Sister    Diabetes Sister     Social History   Socioeconomic History   Marital status: Divorced    Spouse name: Not on file   Number of children: 0   Years of education: Not on file   Highest education level: Some college, no degree  Occupational History   Occupation: Press photographer    Comment: [name]  Tobacco Use   Smoking status: Never   Smokeless tobacco: Never  Vaping Use   Vaping Use:  Never used  Substance and Sexual Activity   Alcohol use: Yes    Alcohol/week: 0.0 standard drinks    Comment: occasionally   Drug use: No   Sexual activity: Not Currently  Other Topics Concern   Not on file  Social History Narrative   Moved to [place] because of new job, but has to commute to [employer].    Lives alone.    Married but not together- never got a divorce   Social Determinants of Radio broadcast assistant Strain: Not on file  Food Insecurity: Not on file  Transportation Needs: Not on file  Physical Activity: Not on file  Stress: Not on file  Social Connections: Not on file   Intimate Partner Violence: Not on file     Current Outpatient Medications:    Albuterol Sulfate, sensor, (PROAIR DIGIHALER) 108 (90 Base) MCG/ACT AEPB, Inhale 2 puffs into the lungs 4 (four) times daily as needed., Disp: 1 each, Rfl: 1   azelastine (ASTELIN) 0.1 % nasal spray, Place 2 sprays into both nostrils 2 (two) times daily. Use in each nostril as directed, Disp: 30 mL, Rfl: 2   cetirizine (ZYRTEC) 10 MG tablet, Take 10 mg by mouth daily., Disp: , Rfl:    cholecalciferol (VITAMIN D3) 25 MCG (1000 UNIT) tablet, Take 1,000 Units by mouth daily., Disp: , Rfl:    DULERA 200-5 MCG/ACT AERO, INHALE 2 PUFFS INTO THE LUNGS TWICE DAILY, Disp: 13 g, Rfl: 0   ezetimibe (ZETIA) 10 MG tablet, Take 1 tablet (10 mg total) by mouth daily., Disp: 90 tablet, Rfl: 1   fluticasone (FLONASE) 50 MCG/ACT nasal spray, Place 1 spray into both nostrils daily. , Disp: , Rfl:    Fremanezumab-vfrm (AJOVY) 225 MG/1.5ML SOSY, Inject 225 mg into the skin every 30 (thirty) days., Disp: 1 mL, Rfl: 5   methylPREDNISolone (MEDROL DOSEPAK) 4 MG TBPK tablet, Take by mouth daily at 12 noon. Take as directed, Disp: 21 tablet, Rfl: 0   montelukast (SINGULAIR) 10 MG tablet, TAKE 1 TABLET(10 MG) BY MOUTH AT BEDTIME, Disp: 90 tablet, Rfl: 1   SUMAtriptan 6 MG/0.5ML SOAJ, INJECT UNDER THE SKIN AS DIRECTED. DO NOT EXCEDD 2 INJECTIONS IN 2 HOURS, Disp: 5 mL, Rfl: 0   valACYclovir (VALTREX) 500 MG tablet, TAKE 1 TABLET(500 MG) BY MOUTH DAILY, Disp: 90 tablet, Rfl: 1  Allergies  Allergen Reactions   Penicillins Anaphylaxis and Hives   Statins     Muscle ache   Topamax [Topiramate]    Tree Extract Other (See Comments)    Sneezing    Victoza [Liraglutide]     Indigestion and worsening of migraines    I personally reviewed active problem list, medication list, allergies, family history, social history, health maintenance with the patient/caregiver today.   ROS  Ten systems reviewed and is negative except as mentioned in HPI     Objective  Virtual encounter, vitals not obtained.  There is no height or weight on file to calculate BMI.  Physical Exam  Awake, alert and oriented   PHQ2/9:    07/23/2021    8:21 AM 06/24/2021    8:46 AM 04/27/2021    9:14 AM 02/20/2020    9:51 AM 09/12/2019   11:13 AM  Depression screen PHQ 2/9  Decreased Interest 0 0 0 2 0  Down, Depressed, Hopeless 0 0 0 2 0  PHQ - 2 Score 0 0 0 4 0  Altered sleeping 0  1 2 0  Tired, decreased energy 0  0 2 0  Change in appetite 0  0 2 0  Feeling bad or failure about yourself  0  0 1 0  Trouble concentrating 0  0 1 0  Moving slowly or fidgety/restless 0  0 1 0  Suicidal thoughts 0  0 0 0  PHQ-9 Score 0  1 13 0  Difficult doing work/chores    Very difficult Not difficult at all   PHQ-2/9 Result is negative.    Fall Risk:    07/23/2021    8:21 AM 06/24/2021    8:45 AM 04/27/2021    9:14 AM 02/20/2020    9:51 AM 09/12/2019   11:13 AM  Fall Risk   Falls in the past year? 0 0 0 0 0  Number falls in past yr: 0 0 0 0 0  Injury with Fall? 0 0 0 0 0  Risk for fall due to : No Fall Risks  No Fall Risks    Follow up Falls prevention discussed  Falls prevention discussed       Assessment & Plan 1. Moderate persistent asthma without complication  - Fluticasone-Umeclidin-Vilant (TRELEGY ELLIPTA) 100-62.5-25 MCG/ACT AEPB; Inhale 1 puff into the lungs daily.  Dispense: 1 each; Refill: 5 - Ambulatory referral to Immunology - montelukast (SINGULAIR) 10 MG tablet; TAKE 1 TABLET(10 MG) BY MOUTH AT BEDTIME  Dispense: 90 tablet; Refill: 1 - Albuterol Sulfate, sensor, (PROAIR DIGIHALER) 108 (90 Base) MCG/ACT AEPB; Inhale 2 puffs into the lungs 4 (four) times daily as needed.  Dispense: 1 each; Refill: 1  2. Familial hyperlipidemia   3. Migraine without aura and without status migrainosus, not intractable  - Ubrogepant (UBRELVY) 100 MG TABS; Take 1 tablet by mouth every other day.  Dispense: 16 tablet; Refill: 5 - SUMAtriptan 6 MG/0.5ML  SOAJ; INJECT UNDER THE SKIN AS DIRECTED. DO NOT EXCEDD 2 INJECTIONS IN 2 HOURS  Dispense: 5 mL; Refill: 0  4. Colon cancer screening  - Cologuard  5. Breast cancer screening by mammogram  - MM Digital Screening; Future  6. Eustachian tube dysfunction, bilateral  - fluticasone (FLONASE) 50 MCG/ACT nasal spray; Place 1 spray into both nostrils daily.  Dispense: 16 g; Refill: 0 - azelastine (ASTELIN) 0.1 % nasal spray; Place 2 sprays into both nostrils 2 (two) times daily. Use in each nostril as directed  Dispense: 30 mL; Refill: 2  7. Perennial allergic rhinitis with seasonal variation  - fluticasone (FLONASE) 50 MCG/ACT nasal spray; Place 1 spray into both nostrils daily.  Dispense: 16 g; Refill: 0 - azelastine (ASTELIN) 0.1 % nasal spray; Place 2 sprays into both nostrils 2 (two) times daily. Use in each nostril as directed  Dispense: 30 mL; Refill: 2   I discussed the assessment and treatment plan with the patient. The patient was provided an opportunity to ask questions and all were answered. The patient agreed with the plan and demonstrated an understanding of the instructions.  The patient was advised to call back or seek an in-person evaluation if the symptoms worsen or if the condition fails to improve as anticipated.  I provided 25  minutes of non-face-to-face time during this encounter.

## 2022-01-02 DIAGNOSIS — R197 Diarrhea, unspecified: Secondary | ICD-10-CM | POA: Diagnosis not present

## 2022-01-02 DIAGNOSIS — R112 Nausea with vomiting, unspecified: Secondary | ICD-10-CM | POA: Diagnosis not present

## 2022-01-02 DIAGNOSIS — A084 Viral intestinal infection, unspecified: Secondary | ICD-10-CM | POA: Diagnosis not present

## 2022-02-09 ENCOUNTER — Encounter: Payer: Self-pay | Admitting: Nurse Practitioner

## 2022-02-12 ENCOUNTER — Telehealth (INDEPENDENT_AMBULATORY_CARE_PROVIDER_SITE_OTHER): Payer: BC Managed Care – PPO | Admitting: Family Medicine

## 2022-02-12 ENCOUNTER — Encounter: Payer: Self-pay | Admitting: Family Medicine

## 2022-02-12 DIAGNOSIS — J320 Chronic maxillary sinusitis: Secondary | ICD-10-CM

## 2022-02-12 MED ORDER — AZITHROMYCIN 500 MG PO TABS: 500.0000 mg | ORAL_TABLET | Freq: Every day | ORAL | 0 refills | Status: DC

## 2022-02-12 MED ORDER — METHYLPREDNISOLONE 4 MG PO TBPK: ORAL_TABLET | Freq: Every day | ORAL | 0 refills | Status: DC

## 2022-02-12 NOTE — Progress Notes (Signed)
Name: Gail Ross   MRN: [id number]    DOB: 1961/02/28   Date:02/12/2022       Progress Note  Subjective  Chief Complaint  Sinusitis  I connected with  [name]  on 02/12/22 at  3:00 PM EDT by a video enabled telemedicine application and verified that I am speaking with the correct person using two identifiers.  I discussed the limitations of evaluation and management by telemedicine and the availability of in person appointments. The patient expressed understanding and agreed to proceed with the virtual visit  Staff also discussed with the patient that there may be a patient responsible charge related to this service. Patient Location: at work  Provider Location: [facility] Additional Individuals present: alone  HPI  Left maxillary sinusitis: she has episodes of sinus infections, this time it started a couple of weeks ago, allergy medication is not helping, left ear feels under water, no fever or chills, lots of mucopurulent drainage from post-nasal area. Pain is on left maxillary sinus this time.    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 Jacqualyn Posey   TUBAL LIGATION      Family History  Problem Relation Age of Onset   Hypertension Mother    Dementia Mother    Heart disease Mother    Heart attack Father    Heart attack Paternal Uncle    Heart attack Paternal Grandfather    Colitis Sister    Diabetes Sister     Social History    Socioeconomic History   Marital status: Divorced    Spouse name: Not on file   Number of children: 0   Years of education: Not on file   Highest education level: Some college, no degree  Occupational History   Occupation: Press photographer    Comment: [name]  Tobacco Use   Smoking status: Never   Smokeless tobacco: Never  Vaping Use   Vaping Use: Never used  Substance and Sexual Activity   Alcohol use: Yes    Alcohol/week: 0.0 standard drinks of alcohol    Comment: occasionally   Drug use: No   Sexual activity: Not Currently  Other Topics Concern   Not on file  Social History Narrative   Moved to [place] because of new job, but has to commute to [employer].    Lives alone.    Married but not together- never got a divorce   Social Determinants of Radio broadcast assistant Strain: Low Risk  (05/31/2019)   Overall Financial Resource Strain (CARDIA)    Difficulty of Paying Living Expenses: Not hard at all  Food Insecurity: No Food Insecurity (05/31/2019)   Hunger Vital Sign    Worried About Running Out of Food in the Last Year: Never true    Ran Out of Food in the Last Year: Never true  Transportation Needs: No Transportation Needs (05/31/2019)   PRAPARE - Hydrologist (Medical):  No    Lack of Transportation (Non-Medical): No  Physical Activity: Inactive (05/31/2019)   Exercise Vital Sign    Days of Exercise per Week: 0 days    Minutes of Exercise per Session: 0 min  Stress: No Stress Concern Present (05/31/2019)   [place]    Feeling of Stress : Not at all  Social Connections: Moderately Isolated (05/31/2019)   Social Connection and Isolation Panel [NHANES]    Frequency of Communication with Friends and Family: More than three times a week    Frequency of Social Gatherings with Friends and Family: More than three times a week    Attends Religious Services: More  than 4 times per year    Active Member of [employer] or Organizations: No    Attends Archivist Meetings: Never    Marital Status: Separated  Intimate Partner Violence: Not At Risk (05/31/2019)   Humiliation, Afraid, Rape, and Kick questionnaire    Fear of Current or Ex-Partner: No    Emotionally Abused: No    Physically Abused: No    Sexually Abused: No     Current Outpatient Medications:    azelastine (ASTELIN) 0.1 % nasal spray, Place 2 sprays into both nostrils 2 (two) times daily. Use in each nostril as directed, Disp: 30 mL, Rfl: 2   cetirizine (ZYRTEC) 10 MG tablet, Take 10 mg by mouth daily., Disp: , Rfl:    Fluticasone-Umeclidin-Vilant (TRELEGY ELLIPTA) 100-62.5-25 MCG/ACT AEPB, Inhale 1 puff into the lungs daily., Disp: 1 each, Rfl: 5   montelukast (SINGULAIR) 10 MG tablet, TAKE 1 TABLET(10 MG) BY MOUTH AT BEDTIME, Disp: 90 tablet, Rfl: 1   SUMAtriptan 6 MG/0.5ML SOAJ, INJECT UNDER THE SKIN AS DIRECTED. DO NOT EXCEDD 2 INJECTIONS IN 2 HOURS, Disp: 5 mL, Rfl: 0   Ubrogepant (UBRELVY) 100 MG TABS, Take 1 tablet by mouth every other day., Disp: 16 tablet, Rfl: 5   valACYclovir (VALTREX) 500 MG tablet, TAKE 1 TABLET(500 MG) BY MOUTH DAILY, Disp: 90 tablet, Rfl: 1   Albuterol Sulfate, sensor, (PROAIR DIGIHALER) 108 (90 Base) MCG/ACT AEPB, Inhale 2 puffs into the lungs 4 (four) times daily as needed. (Patient not taking: Reported on 02/12/2022), Disp: 1 each, Rfl: 1   cholecalciferol (VITAMIN D3) 25 MCG (1000 UNIT) tablet, Take 1,000 Units by mouth daily. (Patient not taking: Reported on 02/12/2022), Disp: , Rfl:    fluticasone (FLONASE) 50 MCG/ACT nasal spray, Place 1 spray into both nostrils daily. (Patient not taking: Reported on 02/12/2022), Disp: 16 g, Rfl: 0  Allergies  Allergen Reactions   Penicillins Anaphylaxis and Hives   Statins     Muscle ache   Topamax [Topiramate]    Tree Extract Other (See Comments)    Sneezing    Victoza [Liraglutide]     Indigestion and  worsening of migraines    I personally reviewed active problem list, medication list, allergies, family history, social history, health maintenance with the patient/caregiver today.   ROS  Ten systems reviewed and is negative except as mentioned in HPI   Objective  Virtual encounter, vitals not obtained.  There is no height or weight on file to calculate BMI.  Physical Exam  Awake, alert and oriented   PHQ2/9:    02/12/2022    2:16 PM 11/30/2021    8:09 AM 07/23/2021    8:21 AM 06/24/2021    8:46 AM 04/27/2021    9:14 AM  Depression screen PHQ 2/9  Decreased  Interest 0 0 0 0 0  Down, Depressed, Hopeless 0 0 0 0 0  PHQ - 2 Score 0 0 0 0 0  Altered sleeping 0  0  1  Tired, decreased energy 0  0  0  Change in appetite 0  0  0  Feeling bad or failure about yourself  0  0  0  Trouble concentrating 0  0  0  Moving slowly or fidgety/restless 0  0  0  Suicidal thoughts 0  0  0  PHQ-9 Score 0  0  1   PHQ-2/9 Result is negative.    Fall Risk:    02/12/2022    2:15 PM 11/30/2021    8:09 AM 07/23/2021    8:21 AM 06/24/2021    8:45 AM 04/27/2021    9:14 AM  Fall Risk   Falls in the past year? 0 0 0 0 0  Number falls in past yr: 0  0 0 0  Injury with Fall? 0  0 0 0  Risk for fall due to : No Fall Risks No Fall Risks No Fall Risks  No Fall Risks  Follow up Falls prevention discussed Falls prevention discussed Falls prevention discussed  Falls prevention discussed     Assessment & Plan   1. Left maxillary sinusitis  - methylPREDNISolone (MEDROL DOSEPAK) 4 MG TBPK tablet; Take by mouth daily.  Dispense: 21 tablet; Refill: 0 - azithromycin (ZITHROMAX) 500 MG tablet; Take 1 tablet (500 mg total) by mouth daily.  Dispense: 3 tablet; Refill: 0   She states usually only gets better with prednisone, last dose 07/2021, we will add high dose azithromycin also   I discussed the assessment and treatment plan with the patient. The patient was provided an opportunity to ask questions  and all were answered. The patient agreed with the plan and demonstrated an understanding of the instructions.  The patient was advised to call back or seek an in-person evaluation if the symptoms worsen or if the condition fails to improve as anticipated.  I provided 15  minutes of non-face-to-face time during this encounter.

## 2022-02-22 ENCOUNTER — Other Ambulatory Visit: Payer: Self-pay | Admitting: Family Medicine

## 2022-02-22 DIAGNOSIS — G43009 Migraine without aura, not intractable, without status migrainosus: Secondary | ICD-10-CM

## 2022-02-23 ENCOUNTER — Telehealth: Payer: BC Managed Care – PPO | Admitting: Family Medicine

## 2022-03-31 ENCOUNTER — Other Ambulatory Visit: Payer: Self-pay | Admitting: Family Medicine

## 2022-03-31 DIAGNOSIS — G43009 Migraine without aura, not intractable, without status migrainosus: Secondary | ICD-10-CM

## 2022-04-12 ENCOUNTER — Encounter: Payer: Self-pay | Admitting: Family Medicine

## 2022-04-12 ENCOUNTER — Other Ambulatory Visit: Payer: Self-pay | Admitting: Family Medicine

## 2022-04-12 DIAGNOSIS — Z8619 Personal history of other infectious and parasitic diseases: Secondary | ICD-10-CM

## 2022-04-13 MED ORDER — VALACYCLOVIR HCL 500 MG PO TABS: ORAL_TABLET | ORAL | 0 refills | Status: DC

## 2022-05-14 ENCOUNTER — Encounter: Payer: Self-pay | Admitting: Internal Medicine

## 2022-05-14 ENCOUNTER — Telehealth (INDEPENDENT_AMBULATORY_CARE_PROVIDER_SITE_OTHER): Payer: BC Managed Care – PPO | Admitting: Internal Medicine

## 2022-05-14 DIAGNOSIS — J301 Allergic rhinitis due to pollen: Secondary | ICD-10-CM

## 2022-05-14 DIAGNOSIS — R0981 Nasal congestion: Secondary | ICD-10-CM

## 2022-05-14 MED ORDER — METHYLPREDNISOLONE 4 MG PO TBPK: ORAL_TABLET | ORAL | 0 refills | Status: DC

## 2022-05-14 NOTE — Progress Notes (Signed)
Virtual Visit via Video Note  I connected with Gail Ross on 05/14/22 at  1:20 PM EDT by a video enabled telemedicine application and verified that I am speaking with the correct person using two identifiers.  Location: Patient: Home  Provider: [facility]   I discussed the limitations of evaluation and management by telemedicine and the availability of in person appointments. The patient expressed understanding and agreed to proceed.  History of Present Illness:  Patient is presenting via telemedicine for sinus pain. Symptoms started 2 weeks ago. Usually happens at the beginning of every new season.  URI Compliant:  -Fever: no -Cough: yes, dry -Shortness of breath: no -Wheezing: no -Chest pain: no -Chest tightness: no -Chest congestion: no -Nasal congestion: yes -Runny nose: yes, yellow  -Post nasal drip: no -Sneezing: yes -Sore throat: no -Sinus pressure: yes -Headache: no -Face pain: no -Ear pain: no  -Ear pressure: yes left  -Context: fluctuating -Relief with OTC cold/cough medications: no  -Treatments attempted: Zyrtec, Singulair, as Astelin, albuterol as needed for asthma, Trelegy.   Observations/Objective:  Ross: well appearing, no acute distress ENT: conjunctiva normal appearing bilaterally, congestion noted on video Neuro: Answers all questions appropriately  Assessment and Plan:  1. Seasonal allergic rhinitis due to pollen/Nasal congestion: Exacerbation of allergies, patient states she gets similar symptoms at the beginning of every season.  She will continue her antihistamine, Singulair, nasal spray and asthma treatment which includes Trelegy daily and albuterol as needed.  We will also send a Medrol Dosepak to help with her symptoms.  Recommend patient take over-the-counter decongestant as well.  Follow-up if symptoms worsen or fail to improve.  - methylPREDNISolone (MEDROL DOSEPAK) 4 MG TBPK tablet; Day 1: Take 8 mg (2 tablets) before breakfast, 4 mg (1  tablet) after lunch, 4 mg (1 tablet) after supper, and 8 mg (2 tablets) at bedtime. Day 2:Take 4 mg (1 tablet) before breakfast, 4 mg (1 tablet) after lunch, 4 mg (1 tablet) after supper, and 8 mg (2 tablets) at bedtime. Day 3: Take 4 mg (1 tablet) before breakfast, 4 mg (1 tablet) after lunch, 4 mg (1 tablet) after supper, and 4 mg (1 tablet) at bedtime. Day 4: Take 4 mg (1 tablet) before breakfast, 4 mg (1 tablet) after lunch, and 4 mg (1 tablet) at bedtime. Day 5: Take 4 mg (1 tablet) before breakfast and 4 mg (1 tablet) at bedtime. Day 6: Take 4 mg (1 tablet) before breakfast.  Dispense: 1 each; Refill: 0  Follow Up Instructions: As needed    I discussed the assessment and treatment plan with the patient. The patient was provided an opportunity to ask questions and all were answered. The patient agreed with the plan and demonstrated an understanding of the instructions.   The patient was advised to call back or seek an in-person evaluation if the symptoms worsen or if the condition fails to improve as anticipated.  I provided 7 minutes of non-face-to-face time during this encounter.   Gail Medici, DO

## 2022-05-18 ENCOUNTER — Telehealth: Payer: BC Managed Care – PPO | Admitting: Family Medicine

## 2022-05-26 ENCOUNTER — Other Ambulatory Visit: Payer: Self-pay | Admitting: Family Medicine

## 2022-05-26 DIAGNOSIS — G43009 Migraine without aura, not intractable, without status migrainosus: Secondary | ICD-10-CM

## 2022-05-26 NOTE — Telephone Encounter (Signed)
Lvm for pt to get scheduled

## 2022-05-31 ENCOUNTER — Other Ambulatory Visit: Payer: Self-pay | Admitting: Family Medicine

## 2022-05-31 DIAGNOSIS — G43009 Migraine without aura, not intractable, without status migrainosus: Secondary | ICD-10-CM

## 2022-06-08 ENCOUNTER — Ambulatory Visit: Payer: BC Managed Care – PPO | Admitting: Family Medicine

## 2022-06-15 NOTE — Progress Notes (Unsigned)
Name: Gail Ross   MRN: [id number]    DOB: 06/30/1961   Date:06/16/2022       Progress Note  Subjective  Chief Complaint  Medication Refill  HPI  Migraine headache: Migraine episodes are described as throbbing sensation usually right side of head, from nuchal area and radiates to frontal area, associated with photophobia, no phonophobia. She also has nausea, no vomiting.  She was on Ajoby but stopped due to cost, switched to [place] and was doing well, but insurance stopped paying after two refills and she noticed increase in frequency of headaches since she has been out. She has migraine about 2 times a week, responding to imitrex for each episode, without medication migraine lasts 2 days  Familial hyperlipidemia: she was taking Crestor 40 mg since 03/2016 , but has noticed a lot muscle pain, spasms, we changed to 20 mg and added Co-Q 10 however leg pain got worse and she stopped in 2020, since last year taking Zetia and otc fish oil, but has been out of all her medications, unable to afford Zetia. We will recheck labs today    Asthma Moderate: she used to see Dr. Tami Ribas. She has been on [facility] and not responding., currently on Trelegy and cough and wheezing under control, very seldom needs to use rescue inhaler   Recurrent sinusitis: she has recurrent episodes. Treated with antibiotics in August. She took medrol dosepack by Dr. Rosana Berger in Nov. She continues to have rhinorrhea in the morning and is very congested all day, unable to smell, also has intermittent left ear fullness . She used to see Dr. Tami Ribas and needs to go back    Vitamin D and B12 deficiency: she needs to have labs done today    Pre-diabetes: there is a family history of diabetes, she denies polyphagia, polydipsia or polyuria. She will have labs done today. She has gained weight due to eating out multiple times a day . BMI is now over 35. 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 Jacqualyn Posey   TUBAL LIGATION      Family History  Problem Relation Age of Onset   Hypertension Mother    Dementia Mother    Heart disease Mother    Heart attack Father    Heart attack Paternal Uncle    Heart attack Paternal Grandfather    Colitis Sister    Diabetes Sister     Social History   Tobacco Use   Smoking status: Never   Smokeless tobacco: Never  Substance Use Topics   Alcohol use: Yes    Alcohol/week: 0.0 standard drinks of alcohol    Comment: occasionally     Current Outpatient Medications:    Albuterol Sulfate, sensor, (PROAIR DIGIHALER) 108 (90 Base) MCG/ACT AEPB, Inhale 2 puffs into the lungs 4 (four) times daily as needed., Disp: 1 each, Rfl: 1  cetirizine (ZYRTEC) 10 MG tablet, Take 10 mg by mouth daily., Disp: , Rfl:    cholecalciferol (VITAMIN D3) 25 MCG (1000 UNIT) tablet, Take 1,000 Units by mouth daily., Disp: , Rfl:    Rimegepant Sulfate (NURTEC) 75 MG TBDP, Take 1 tablet by mouth every other day., Disp: 16 tablet, Rfl: 5   azelastine (ASTELIN) 0.1 % nasal spray, Place 2 sprays into both nostrils 2 (two) times daily. Use in each nostril as directed, Disp: 30 mL, Rfl: 2   Fluticasone-Umeclidin-Vilant (TRELEGY ELLIPTA) 100-62.5-25 MCG/ACT AEPB, Inhale 1 puff into the lungs daily., Disp: 1 each, Rfl: 5   montelukast (SINGULAIR) 10 MG tablet, TAKE 1  TABLET(10 MG) BY MOUTH AT BEDTIME, Disp: 90 tablet, Rfl: 1   SUMAtriptan 6 MG/0.5ML SOAJ, INJECT 0.5 ML UNDER THE SKIN AS DIRECTED. NO NOT EXCEED 2 INJECTIONS IN 2 HOURS, Disp: 5 mL, Rfl: 0   valACYclovir (VALTREX) 500 MG tablet, TAKE 1 TABLET(500 MG) BY MOUTH DAILY, Disp: 90 tablet, Rfl: 1  Allergies  Allergen Reactions   Penicillins Anaphylaxis and Hives   Statins     Muscle ache   Topamax [Topiramate]    Tree Extract Other (See Comments)    Sneezing    Victoza [Liraglutide]     Indigestion and worsening of migraines    I personally reviewed active problem list, medication list, allergies, family history, social history, health maintenance with the patient/caregiver today.   ROS  Constitutional: Negative for fever or weight change.  Respiratory: Negative for cough and shortness of breath.   Cardiovascular: Negative for chest pain or palpitations.  Gastrointestinal: Negative for abdominal pain, no bowel changes.  Musculoskeletal: Negative for gait problem or joint swelling.  Skin: Negative for rash.  Neurological: Negative for dizziness or headache.  No other specific complaints in a complete review of systems (except as listed in HPI above).   Objective  Vitals:   06/16/22 0835  BP: 122/68  Pulse: 96  Resp: 16  SpO2: 97%  Weight: 206 lb (93.4 kg)  Height: '5\' 3"'$  (1.6 m)    Body mass index is 36.49 kg/m.  Physical Exam  Constitutional: Patient appears well-developed and well-nourished. Obese  No distress.  HEENT: head atraumatic, normocephalic, pupils equal and reactive to light, ears normal TM, neck supple, throat within normal limits, tender during palpation fo maxillary sinus  Cardiovascular: Normal rate, regular rhythm and normal heart sounds.  No murmur heard. No BLE edema. Pulmonary/Chest: Effort normal and breath sounds normal. No respiratory distress. Abdominal: Soft.  There is no tenderness. Psychiatric: Patient has a normal mood and affect. behavior is  normal. Judgment and thought content normal.   PHQ2/9:    06/16/2022    8:36 AM 05/14/2022   11:39 AM 02/12/2022    2:16 PM 11/30/2021    8:09 AM 07/23/2021    8:21 AM  Depression screen PHQ 2/9  Decreased Interest 0 0 0 0 0  Down, Depressed, Hopeless 0 0 0 0 0  PHQ - 2 Score 0 0 0 0 0  Altered sleeping 0 0 0  0  Tired, decreased energy 0 0 0  0  Change in appetite 0 0 0  0  Feeling bad or failure about yourself  0 0 0  0  Trouble concentrating 0 0 0  0  Moving slowly or fidgety/restless 0 0 0  0  Suicidal thoughts 0 0 0  0  PHQ-9 Score 0 0 0  0  Difficult doing work/chores  Not difficult  at all       phq 9 is negative   Fall Risk:    06/16/2022    8:36 AM 05/14/2022   11:39 AM 02/12/2022    2:15 PM 11/30/2021    8:09 AM 07/23/2021    8:21 AM  Fall Risk   Falls in the past year? 0 0 0 0 0  Number falls in past yr: 0 0 0  0  Injury with Fall? 0 0 0  0  Risk for fall due to : No Fall Risks  No Fall Risks No Fall Risks No Fall Risks  Follow up Falls prevention discussed  Falls prevention discussed Falls prevention discussed Falls prevention discussed      Functional Status Survey: Is the patient deaf or have difficulty hearing?: No Does the patient have difficulty seeing, even when wearing glasses/contacts?: No Does the patient have difficulty concentrating, remembering, or making decisions?: No Does the patient have difficulty walking or climbing stairs?: No Does the patient have difficulty dressing or bathing?: No Does the patient have difficulty doing errands alone such as visiting a doctor's office or shopping?: No    Assessment & Plan  1. Moderate persistent asthma without complication  - montelukast (SINGULAIR) 10 MG tablet; TAKE 1 TABLET(10 MG) BY MOUTH AT BEDTIME  Dispense: 90 tablet; Refill: 1 - Fluticasone-Umeclidin-Vilant (TRELEGY ELLIPTA) 100-62.5-25 MCG/ACT AEPB; Inhale 1 puff into the lungs daily.  Dispense: 1 each; Refill: 5  2. Migraine without aura and  without status migrainosus, not intractable  - SUMAtriptan 6 MG/0.5ML SOAJ; INJECT 0.5 ML UNDER THE SKIN AS DIRECTED. NO NOT EXCEED 2 INJECTIONS IN 2 HOURS  Dispense: 5 mL; Refill: 0 - Rimegepant Sulfate (NURTEC) 75 MG TBDP; Take 1 tablet by mouth every other day.  Dispense: 16 tablet; Refill: 5  3. Familial hyperlipidemia  - Lipid panel  4. Perennial allergic rhinitis with seasonal variation  - azelastine (ASTELIN) 0.1 % nasal spray; Place 2 sprays into both nostrils 2 (two) times daily. Use in each nostril as directed  Dispense: 30 mL; Refill: 2  5. Vitamin B12 deficiency  - CBC with Differential/Platelet - B12 and Folate Panel  6. Chronic sinusitis of both maxillary sinuses  She needs to go back to ENT  7. Needs flu shot  - Flu Vaccine QUAD 6+ mos PF IM (Fluarix Quad PF)  8. Need for Tdap vaccination  - Tdap vaccine greater than or equal to 7yo IM  9. Eustachian tube dysfunction, bilateral  - azelastine (ASTELIN) 0.1 % nasal spray; Place 2 sprays into both nostrils 2 (two) times daily. Use in each nostril as directed  Dispense: 30 mL; Refill: 2  10. Vitamin D deficiency  - VITAMIN D 25 Hydroxy (Vit-D Deficiency, Fractures)  11. Pre-diabetes  - Hemoglobin A1c  12. Colon cancer screening  - Cologuard  13. Breast cancer screening by mammogram  - MM 3D SCREEN BREAST BILATERAL; Future  14. Long-term use of high-risk medication  - COMPLETE METABOLIC PANEL WITH GFR  15. History of shingles  - valACYclovir (VALTREX) 500 MG tablet; TAKE 1 TABLET(500 MG) BY MOUTH DAILY  Dispense: 90 tablet; Refill: 1

## 2022-06-16 ENCOUNTER — Encounter: Payer: Self-pay | Admitting: Family Medicine

## 2022-06-16 ENCOUNTER — Ambulatory Visit: Payer: BC Managed Care – PPO | Admitting: Family Medicine

## 2022-06-16 VITALS — BP 122/68 | HR 96 | Resp 16 | Ht 63.0 in | Wt 206.0 lb

## 2022-06-16 DIAGNOSIS — Z79899 Other long term (current) drug therapy: Secondary | ICD-10-CM

## 2022-06-16 DIAGNOSIS — J4540 Moderate persistent asthma, uncomplicated: Secondary | ICD-10-CM | POA: Diagnosis not present

## 2022-06-16 DIAGNOSIS — E7849 Other hyperlipidemia: Secondary | ICD-10-CM | POA: Diagnosis not present

## 2022-06-16 DIAGNOSIS — J302 Other seasonal allergic rhinitis: Secondary | ICD-10-CM

## 2022-06-16 DIAGNOSIS — E538 Deficiency of other specified B group vitamins: Secondary | ICD-10-CM | POA: Diagnosis not present

## 2022-06-16 DIAGNOSIS — Z23 Encounter for immunization: Secondary | ICD-10-CM

## 2022-06-16 DIAGNOSIS — H6993 Unspecified Eustachian tube disorder, bilateral: Secondary | ICD-10-CM

## 2022-06-16 DIAGNOSIS — G43009 Migraine without aura, not intractable, without status migrainosus: Secondary | ICD-10-CM

## 2022-06-16 DIAGNOSIS — R7303 Prediabetes: Secondary | ICD-10-CM | POA: Diagnosis not present

## 2022-06-16 DIAGNOSIS — Z8619 Personal history of other infectious and parasitic diseases: Secondary | ICD-10-CM

## 2022-06-16 DIAGNOSIS — E559 Vitamin D deficiency, unspecified: Secondary | ICD-10-CM

## 2022-06-16 DIAGNOSIS — J3089 Other allergic rhinitis: Secondary | ICD-10-CM

## 2022-06-16 DIAGNOSIS — Z1231 Encounter for screening mammogram for malignant neoplasm of breast: Secondary | ICD-10-CM

## 2022-06-16 DIAGNOSIS — J320 Chronic maxillary sinusitis: Secondary | ICD-10-CM

## 2022-06-16 DIAGNOSIS — Z1211 Encounter for screening for malignant neoplasm of colon: Secondary | ICD-10-CM

## 2022-06-16 MED ORDER — NURTEC 75 MG PO TBDP: 1.0000 | ORAL_TABLET | ORAL | 5 refills | Status: DC

## 2022-06-16 MED ORDER — AZELASTINE HCL 0.1 % NA SOLN: 2.0000 | Freq: Two times a day (BID) | NASAL | 2 refills | Status: DC

## 2022-06-16 MED ORDER — MONTELUKAST SODIUM 10 MG PO TABS: ORAL_TABLET | ORAL | 1 refills | Status: DC

## 2022-06-16 MED ORDER — VALACYCLOVIR HCL 500 MG PO TABS: ORAL_TABLET | ORAL | 1 refills | Status: DC

## 2022-06-16 MED ORDER — TRELEGY ELLIPTA 100-62.5-25 MCG/ACT IN AEPB: 1.0000 | INHALATION_SPRAY | Freq: Every day | RESPIRATORY_TRACT | 5 refills | Status: DC

## 2022-06-16 MED ORDER — SUMATRIPTAN SUCCINATE 6 MG/0.5ML ~~LOC~~ SOAJ: SUBCUTANEOUS | 0 refills | Status: DC

## 2022-06-16 NOTE — Patient Instructions (Addendum)
Weight loss medication :  Wegovy Zepbound    Vaccines to get at a local pharmacy RSV  Shingrix

## 2022-06-17 LAB — COMPLETE METABOLIC PANEL WITH GFR
AG Ratio: 1.9 (calc) (ref 1.0–2.5)
ALT: 20 U/L (ref 6–29)
AST: 19 U/L (ref 10–35)
Albumin: 4.2 g/dL (ref 3.6–5.1)
Alkaline phosphatase (APISO): 101 U/L (ref 37–153)
BUN: 11 mg/dL (ref 7–25)
CO2: 24 mmol/L (ref 20–32)
Calcium: 9.4 mg/dL (ref 8.6–10.4)
Chloride: 107 mmol/L (ref 98–110)
Creat: 0.86 mg/dL (ref 0.50–1.05)
Globulin: 2.2 g/dL (calc) (ref 1.9–3.7)
Glucose, Bld: 89 mg/dL (ref 65–99)
Potassium: 4.2 mmol/L (ref 3.5–5.3)
Sodium: 142 mmol/L (ref 135–146)
Total Bilirubin: 0.4 mg/dL (ref 0.2–1.2)
Total Protein: 6.4 g/dL (ref 6.1–8.1)
eGFR: 76 mL/min/{1.73_m2} (ref >=60)

## 2022-06-17 LAB — LIPID PANEL
Cholesterol: 311 mg/dL — ABNORMAL HIGH (ref <200)
HDL: 45 mg/dL — ABNORMAL LOW (ref >=50)
LDL Cholesterol (Calc): 224 mg/dL (calc) — ABNORMAL HIGH
Non-HDL Cholesterol (Calc): 266 mg/dL (calc) — ABNORMAL HIGH (ref <130)
Total CHOL/HDL Ratio: 6.9 (calc) — ABNORMAL HIGH (ref <5.0)
Triglycerides: 229 mg/dL — ABNORMAL HIGH (ref <150)

## 2022-06-17 LAB — B12 AND FOLATE PANEL
Folate: 9.2 ng/mL
Vitamin B-12: 305 pg/mL (ref 200–1100)

## 2022-06-17 LAB — HEMOGLOBIN A1C
Hgb A1c MFr Bld: 5.6 % of total Hgb (ref <5.7)
Mean Plasma Glucose: 114 mg/dL
eAG (mmol/L): 6.3 mmol/L

## 2022-06-17 LAB — VITAMIN D 25 HYDROXY (VIT D DEFICIENCY, FRACTURES): Vit D, 25-Hydroxy: 14 ng/mL — ABNORMAL LOW (ref 30–100)

## 2022-06-17 LAB — CBC WITH DIFFERENTIAL/PLATELET
Absolute Monocytes: 374 cells/uL (ref 200–950)
Basophils Absolute: 19 cells/uL (ref 0–200)
Basophils Relative: 0.4 %
Eosinophils Absolute: 302 cells/uL (ref 15–500)
Eosinophils Relative: 6.3 %
HCT: 38.9 % (ref 35.0–45.0)
Hemoglobin: 13.4 g/dL (ref 11.7–15.5)
Lymphs Abs: 1382 cells/uL (ref 850–3900)
MCH: 30.9 pg (ref 27.0–33.0)
MCHC: 34.4 g/dL (ref 32.0–36.0)
MCV: 89.6 fL (ref 80.0–100.0)
MPV: 10.1 fL (ref 7.5–12.5)
Monocytes Relative: 7.8 %
Neutro Abs: 2722 cells/uL (ref 1500–7800)
Neutrophils Relative %: 56.7 %
Platelets: 275 10*3/uL (ref 140–400)
RBC: 4.34 10*6/uL (ref 3.80–5.10)
RDW: 12.7 % (ref 11.0–15.0)
Total Lymphocyte: 28.8 %
WBC: 4.8 10*3/uL (ref 3.8–10.8)

## 2022-06-21 ENCOUNTER — Other Ambulatory Visit: Payer: Self-pay | Admitting: Family Medicine

## 2022-06-21 DIAGNOSIS — Z789 Other specified health status: Secondary | ICD-10-CM

## 2022-06-21 DIAGNOSIS — E7849 Other hyperlipidemia: Secondary | ICD-10-CM

## 2022-06-21 MED ORDER — NEXLIZET 180-10 MG PO TABS: 1.0000 | ORAL_TABLET | Freq: Every day | ORAL | 1 refills | Status: DC

## 2022-07-09 ENCOUNTER — Encounter: Payer: BC Managed Care – PPO | Admitting: Family Medicine

## 2022-07-13 ENCOUNTER — Encounter: Payer: Self-pay | Admitting: Family Medicine

## 2022-08-07 ENCOUNTER — Other Ambulatory Visit: Payer: Self-pay | Admitting: Family Medicine

## 2022-08-07 DIAGNOSIS — G43009 Migraine without aura, not intractable, without status migrainosus: Secondary | ICD-10-CM

## 2022-08-16 ENCOUNTER — Ambulatory Visit
Admission: RE | Admit: 2022-08-16 | Discharge: 2022-08-16 | Disposition: A | Discharge status: Home / Self Care | Payer: BC Managed Care – PPO | Source: Ambulatory Visit | Attending: Family Medicine | Admitting: Family Medicine

## 2022-08-16 DIAGNOSIS — Z1231 Encounter for screening mammogram for malignant neoplasm of breast: Secondary | ICD-10-CM | POA: Diagnosis not present

## 2022-08-17 ENCOUNTER — Encounter: Payer: Self-pay | Admitting: Family Medicine

## 2022-08-18 ENCOUNTER — Other Ambulatory Visit: Payer: Self-pay | Admitting: Family Medicine

## 2022-08-18 DIAGNOSIS — Z8619 Personal history of other infectious and parasitic diseases: Secondary | ICD-10-CM

## 2022-09-17 ENCOUNTER — Ambulatory Visit: Payer: BC Managed Care – PPO | Admitting: Family Medicine

## 2022-09-20 ENCOUNTER — Other Ambulatory Visit: Payer: Self-pay | Admitting: Family Medicine

## 2022-09-20 DIAGNOSIS — G43009 Migraine without aura, not intractable, without status migrainosus: Secondary | ICD-10-CM

## 2022-09-23 ENCOUNTER — Other Ambulatory Visit: Payer: Self-pay | Admitting: Family Medicine

## 2022-09-23 DIAGNOSIS — G43009 Migraine without aura, not intractable, without status migrainosus: Secondary | ICD-10-CM

## 2022-09-24 MED ORDER — SUMATRIPTAN SUCCINATE 6 MG/0.5ML ~~LOC~~ SOAJ: SUBCUTANEOUS | 0 refills | Status: DC

## 2022-10-21 ENCOUNTER — Other Ambulatory Visit: Payer: Self-pay | Admitting: Family Medicine

## 2022-10-21 DIAGNOSIS — G43009 Migraine without aura, not intractable, without status migrainosus: Secondary | ICD-10-CM

## 2022-10-26 ENCOUNTER — Ambulatory Visit: Payer: BC Managed Care – PPO | Admitting: Family Medicine

## 2022-10-29 ENCOUNTER — Ambulatory Visit: Payer: BC Managed Care – PPO | Admitting: Family Medicine

## 2022-11-01 ENCOUNTER — Ambulatory Visit: Payer: BC Managed Care – PPO | Admitting: Family Medicine

## 2022-11-01 NOTE — Progress Notes (Unsigned)
Name: Gail Ross   MRN: [id number]    DOB: [birth year]   Date:11/02/2022       Progress Note  Subjective  Chief Complaint  Medication Refill  HPI  Migraine headache: Migraine episodes are described as throbbing sensation usually right side of head, from nuchal area and radiates to frontal area, associated with photophobia, no phonophobia. She also has nausea, no vomiting.  She was on Ajoby but stopped due to cost, switched to [place] and was doing well, but insurance stopped paying after two refills , currently having a lot of episodes, worse when stressed at work, we will try topamax again and see if she can tolerate it nwo  Familial hyperlipidemia: she was taking Crestor 40 mg since 03/2016 , but has noticed a lot muscle pain, spasms, we changed to 20 mg and added Co-Q 10 however leg pain got worse and she stopped in 2020, we changed to Zetia and it was too costly, insurance denied Nexlizet. She is willing to try another statin at this time   Asthma Moderate: she used to see Dr. Jenne Campus. She has been on [facility] and not responding., currently on Trelegy and cough and wheezing under control, she uses rescue occasionally   Recurrent sinusitis: . She used to see Dr. Jenne Campus , she needs to go back for re-evaluation. She is having nasal congestion and rhinorrhea    Vitamin D and B12 deficiency: taking supplementations    Pre-diabetes: there is a family history of diabetes, she denies polyphagia, polydipsia or polyuria.   Morbid obesity: BMI is over 35 with co-morbidities such as hyperlipidemia and pre diabetes. She denies personal history of pancreatitis or family history of thyroid cancer. She has increased intake of fruit and vegetables and cutting down on fast food. Having more like a low carb and high in protein . She really wants to lose weight.   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 Ardelle Anton   TUBAL LIGATION      Family History  Problem Relation Age of Onset   Hypertension Mother    Dementia Mother    Heart disease Mother    Heart attack Father    Heart attack Paternal Uncle    Heart attack Paternal Grandfather    Colitis Sister    Diabetes Sister     Social History   Tobacco Use   Smoking status: Never   Smokeless tobacco: Never  Substance Use Topics   Alcohol use: Yes    Alcohol/week: 0.0 standard drinks of alcohol    Comment: occasionally     Current Outpatient Medications:    Albuterol Sulfate, sensor, (PROAIR DIGIHALER) 108 (90 Base) MCG/ACT AEPB, Inhale 2 puffs into the lungs 4 (four) times daily as needed., Disp: 1 each, Rfl: 1   azelastine (ASTELIN) 0.1 % nasal spray, Place 2 sprays into both nostrils 2 (two) times daily. Use in each nostril as directed,  Disp: 30 mL, Rfl: 2   Bempedoic Acid-Ezetimibe (NEXLIZET) 180-10 MG TABS, Take 1 tablet by mouth daily at 12 noon., Disp: 90 tablet, Rfl: 1   cetirizine (ZYRTEC) 10 MG tablet, Take 10 mg by mouth daily., Disp: , Rfl:    cholecalciferol (VITAMIN D3) 25 MCG (1000 UNIT) tablet, Take 1,000 Units by mouth daily., Disp: , Rfl:    Fluticasone-Umeclidin-Vilant (TRELEGY ELLIPTA) 100-62.5-25 MCG/ACT AEPB, Inhale 1 puff into the lungs daily., Disp: 1 each, Rfl: 5   montelukast (SINGULAIR) 10 MG tablet, TAKE 1 TABLET(10 MG) BY MOUTH AT BEDTIME, Disp: 90 tablet, Rfl: 1   Rimegepant Sulfate  (NURTEC) 75 MG TBDP, Take 1 tablet by mouth every other day., Disp: 16 tablet, Rfl: 5   SUMAtriptan 6 MG/0.5ML SOAJ, INJECT 0.5 ML(6MG ) UNDER THE SKIN AS DIRECTED. DO NOT EXCEED 2 INJECTIONS IN 2 HOURS, Disp: 5 mL, Rfl: 0   valACYclovir (VALTREX) 500 MG tablet, TAKE 1 TABLET(500 MG) BY MOUTH DAILY, Disp: 90 tablet, Rfl: 1  Allergies  Allergen Reactions   Penicillins Anaphylaxis and Hives   Statins     Muscle ache   Topamax [Topiramate]    Tree Extract Other (See Comments)    Sneezing    Victoza [Liraglutide]     Indigestion and worsening of migraines    I personally reviewed active problem list, medication list, allergies, family history, social history, health maintenance with the patient/caregiver today.   ROS  Ten systems reviewed and is negative except as mentioned in HPI    Objective  Vitals:   11/02/22 1016  BP: 134/86  Pulse: 93  Resp: 16  SpO2: 96%  Weight: 215 lb (97.5 kg)  Height:  (1.626 m)    Body mass index is 36.9 kg/m.  Physical Exam  Constitutional: Patient appears well-developed and well-nourished. Obese  No distress.  HEENT: head atraumatic, normocephalic, pupils equal and reactive to light, neck supple Cardiovascular: Normal rate, regular rhythm and normal heart sounds.  No murmur heard. No BLE edema. Pulmonary/Chest: Effort normal and breath sounds normal. No respiratory distress. Abdominal: Soft.  There is no tenderness. Psychiatric: Patient has a normal mood and affect. behavior is normal. Judgment and thought content normal.   PHQ2/9:    11/02/2022   10:15 AM 06/16/2022    8:36 AM 05/14/2022   11:39 AM 02/12/2022    2:16 PM 11/30/2021    8:09 AM  Depression screen PHQ 2/9  Decreased Interest 0 0 0 0 0  Down, Depressed, Hopeless 0 0 0 0 0  PHQ - 2 Score 0 0 0 0 0  Altered sleeping 0 0 0 0   Tired, decreased energy 0 0 0 0   Change in appetite 0 0 0 0   Feeling bad or failure about yourself  0 0 0 0   Trouble concentrating 0 0 0 0    Moving slowly or fidgety/restless 0 0 0 0   Suicidal thoughts 0 0 0 0   PHQ-9 Score 0 0 0 0   Difficult doing work/chores   Not difficult at all      phq 9 is negative   Fall Risk:    11/02/2022   10:15 AM 06/16/2022    8:36 AM 05/14/2022   11:39 AM 02/12/2022    2:15 PM 11/30/2021    8:09 AM  Fall Risk   Falls in the past year? 0 0 0 0 0  Number falls in past yr: 0 0 0 0   Injury  with Fall? 0 0 0 0   Risk for fall due to : No Fall Risks No Fall Risks  No Fall Risks No Fall Risks  Follow up Falls prevention discussed Falls prevention discussed  Falls prevention discussed Falls prevention discussed      Functional Status Survey: Is the patient deaf or have difficulty hearing?: No Does the patient have difficulty seeing, even when wearing glasses/contacts?: No Does the patient have difficulty concentrating, remembering, or making decisions?: No Does the patient have difficulty walking or climbing stairs?: No Does the patient have difficulty dressing or bathing?: No Does the patient have difficulty doing errands alone such as visiting a doctor's office or shopping?: No    Assessment & Plan   1. Familial hyperlipidemia  - pravastatin (PRAVACHOL) 40 MG tablet; Take 1 tablet (40 mg total) by mouth daily.  Dispense: 90 tablet; Refill: 1  2. Moderate persistent asthma without complication  - Fluticasone-Umeclidin-Vilant (TRELEGY ELLIPTA) 100-62.5-25 MCG/ACT AEPB; Inhale 1 puff into the lungs daily.  Dispense: 1 each; Refill: 5 - montelukast (SINGULAIR) 10 MG tablet; TAKE 1 TABLET(10 MG) BY MOUTH AT BEDTIME  Dispense: 90 tablet; Refill: 1 - Albuterol Sulfate, sensor, (PROAIR DIGIHALER) 108 (90 Base) MCG/ACT AEPB; Inhale 2 puffs into the lungs 4 (four) times daily as needed.  Dispense: 1 each; Refill: 1  3. Vitamin B12 deficiency  Continue supplementation   4. Morbid obesity  - tirzepatide (ZEPBOUND) 2.5 MG/0.5ML Pen; Inject 2.5 mg into the skin once a week.  Dispense: 2 mL;  Refill: 0  5. Perennial allergic rhinitis with seasonal variation  Not controlled needs to see Dr. Jenne Campus   6. Migraine without aura and without status migrainosus, not intractable  - SUMAtriptan 6 MG/0.5ML SOAJ; INJECT 0.5 ML(6MG ) UNDER THE SKIN AS DIRECTED. DO NOT EXCEED 2 INJECTIONS IN 2 HOURS  Dispense: 5 mL; Refill: 5 - topiramate (TOPAMAX) 50 MG tablet; Take 1-2 tablets (50-100 mg total) by mouth 2 (two) times daily.  Dispense: 180 tablet; Refill: 0  7. Pre-diabetes   8. Vitamin D deficiency  - Vitamin D, Ergocalciferol, (DRISDOL) 1.25 MG (50000 UNIT) CAPS capsule; Take 1 capsule (50,000 Units total) by mouth every 7 (seven) days.  Dispense: 12 capsule; Refill: 1  9 History of shingles  - valACYclovir (VALTREX) 500 MG tablet; TAKE 1 TABLET(500 MG) BY MOUTH DAILY  Dispense: 90 tablet; Refill: 1

## 2022-11-02 ENCOUNTER — Ambulatory Visit (INDEPENDENT_AMBULATORY_CARE_PROVIDER_SITE_OTHER): Payer: BC Managed Care – PPO | Admitting: Family Medicine

## 2022-11-02 ENCOUNTER — Other Ambulatory Visit: Payer: Self-pay | Admitting: Family Medicine

## 2022-11-02 ENCOUNTER — Encounter: Payer: Self-pay | Admitting: Family Medicine

## 2022-11-02 VITALS — BP 134/86 | HR 93 | Resp 16 | Ht 64.0 in | Wt 215.0 lb

## 2022-11-02 DIAGNOSIS — Z8619 Personal history of other infectious and parasitic diseases: Secondary | ICD-10-CM

## 2022-11-02 DIAGNOSIS — E538 Deficiency of other specified B group vitamins: Secondary | ICD-10-CM | POA: Diagnosis not present

## 2022-11-02 DIAGNOSIS — H6993 Unspecified Eustachian tube disorder, bilateral: Secondary | ICD-10-CM | POA: Insufficient documentation

## 2022-11-02 DIAGNOSIS — E7849 Other hyperlipidemia: Secondary | ICD-10-CM | POA: Diagnosis not present

## 2022-11-02 DIAGNOSIS — J4540 Moderate persistent asthma, uncomplicated: Secondary | ICD-10-CM | POA: Diagnosis not present

## 2022-11-02 DIAGNOSIS — G43009 Migraine without aura, not intractable, without status migrainosus: Secondary | ICD-10-CM

## 2022-11-02 DIAGNOSIS — J302 Other seasonal allergic rhinitis: Secondary | ICD-10-CM | POA: Insufficient documentation

## 2022-11-02 DIAGNOSIS — Z789 Other specified health status: Secondary | ICD-10-CM | POA: Insufficient documentation

## 2022-11-02 DIAGNOSIS — R7303 Prediabetes: Secondary | ICD-10-CM

## 2022-11-02 DIAGNOSIS — E559 Vitamin D deficiency, unspecified: Secondary | ICD-10-CM

## 2022-11-02 DIAGNOSIS — J3089 Other allergic rhinitis: Secondary | ICD-10-CM

## 2022-11-02 MED ORDER — PRAVASTATIN SODIUM 40 MG PO TABS: 40.0000 mg | ORAL_TABLET | Freq: Every day | ORAL | 1 refills | Status: DC

## 2022-11-02 MED ORDER — MONTELUKAST SODIUM 10 MG PO TABS: ORAL_TABLET | ORAL | 1 refills | Status: DC

## 2022-11-02 MED ORDER — VALACYCLOVIR HCL 500 MG PO TABS: ORAL_TABLET | ORAL | 1 refills | Status: DC

## 2022-11-02 MED ORDER — TRELEGY ELLIPTA 100-62.5-25 MCG/ACT IN AEPB
1.0000 | INHALATION_SPRAY | Freq: Every day | RESPIRATORY_TRACT | 5 refills | Status: DC
Start: 2022-11-02 — End: 2023-11-21

## 2022-11-02 MED ORDER — TOPIRAMATE 50 MG PO TABS
50.0000 mg | ORAL_TABLET | Freq: Two times a day (BID) | ORAL | 0 refills | Status: DC
Start: 2022-11-02 — End: 2023-03-07

## 2022-11-02 MED ORDER — SUMATRIPTAN SUCCINATE 6 MG/0.5ML ~~LOC~~ SOAJ: SUBCUTANEOUS | 5 refills | Status: DC

## 2022-11-02 MED ORDER — PROAIR DIGIHALER 108 (90 BASE) MCG/ACT IN AEPB
2.0000 | INHALATION_SPRAY | Freq: Four times a day (QID) | RESPIRATORY_TRACT | 1 refills | Status: DC | PRN
Start: 2022-11-02 — End: 2024-02-23

## 2022-11-02 MED ORDER — VITAMIN D (ERGOCALCIFEROL) 1.25 MG (50000 UNIT) PO CAPS: 50000.0000 [IU] | ORAL_CAPSULE | ORAL | 1 refills | Status: DC

## 2022-11-02 MED ORDER — ZEPBOUND 2.5 MG/0.5ML ~~LOC~~ SOAJ: 2.5000 mg | SUBCUTANEOUS | 0 refills | Status: DC

## 2022-12-16 ENCOUNTER — Encounter: Payer: BC Managed Care – PPO | Admitting: Family Medicine

## 2023-03-06 ENCOUNTER — Other Ambulatory Visit: Payer: Self-pay | Admitting: Family Medicine

## 2023-03-06 DIAGNOSIS — G43009 Migraine without aura, not intractable, without status migrainosus: Secondary | ICD-10-CM

## 2023-03-07 ENCOUNTER — Other Ambulatory Visit: Payer: Self-pay

## 2023-03-07 DIAGNOSIS — G43009 Migraine without aura, not intractable, without status migrainosus: Secondary | ICD-10-CM

## 2023-04-25 ENCOUNTER — Ambulatory Visit
Admission: EM | Admit: 2023-04-25 | Discharge: 2023-04-25 | Disposition: A | Discharge status: Home / Self Care | Payer: BC Managed Care – PPO | Attending: Physician Assistant | Admitting: Physician Assistant

## 2023-04-25 DIAGNOSIS — L500 Allergic urticaria: Secondary | ICD-10-CM | POA: Diagnosis not present

## 2023-04-25 MED ORDER — METHYLPREDNISOLONE SODIUM SUCC 125 MG IJ SOLR: 80.0000 mg | Freq: Once | INTRAMUSCULAR | Status: DC

## 2023-04-25 MED ORDER — PREDNISONE 10 MG (21) PO TBPK: ORAL_TABLET | ORAL | 0 refills | Status: DC

## 2023-04-25 MED ORDER — FAMOTIDINE 20 MG PO TABS: 20.0000 mg | ORAL_TABLET | Freq: Two times a day (BID) | ORAL | 0 refills | Status: DC

## 2023-04-25 MED ORDER — METHYLPREDNISOLONE ACETATE 80 MG/ML IJ SUSP
80.0000 mg | Freq: Once | INTRAMUSCULAR | Status: AC
  Administered 2023-04-25: 80 mg via INTRAMUSCULAR

## 2023-04-25 NOTE — ED Triage Notes (Signed)
Patient to Urgent Care with complaints of itchy rash present to entire body.  Reports symptoms started two days ago. Just finished taking Bactrim for a UTI. Denies any other new product usage.   Has been taking benadryl.

## 2023-04-25 NOTE — Discharge Instructions (Signed)
I am glad that you are starting to feel better with the medication.  Start prednisone taper tomorrow (04/26/2023) do not take NSAIDs with this medication including aspirin, ibuprofen/Advil, naproxen/Aleve.  You can use acetaminophen/Tylenol as needed.  Continue cetirizine daily.  Take famotidine twice daily.  We have added Bactrim DS to your allergy list but make sure that all of your other providers know this as well.  If you develop any swelling of your throat, shortness of breath, muffled voice, nausea, vomiting, worsening rash you need to be seen immediately.

## 2023-04-25 NOTE — ED Provider Notes (Signed)
Renaldo Fiddler    CSN: [id number] Arrival date & time: 04/25/23  7829      History   Chief Complaint Chief Complaint  Patient presents with   Rash    HPI Gail Ross is a 62 y.o. female.   Patient presents today with a 2-day history of widespread pruritic rash involving all of her body.  She reports that symptoms began after she completed a course of sulfamethoxazole-trimethoprim.  She has had allergic reactions to medications in the past with similar presentation.  She has tried Benadryl without improvement of symptoms.  She denies any additional new exposures including to plants, insects, animals.  Denies any recent steroids.  She denies any swelling of her throat, shortness of breath, muffled voice, nausea, vomiting.  She denies any history of diabetes and has taken prednisone in the past without side effect or concern.  She reports the itching is intense and interfering with her ability to perform daily activities.    Past Medical History:  Diagnosis Date   History of shingles    Hyperlipidemia    Migraine without aura, not intractable    Overweight     Patient Active Problem List   Diagnosis Date Noted   Vitamin D deficiency 11/02/2022   Perennial allergic rhinitis with seasonal variation 11/02/2022   Vitamin B12 deficiency 11/02/2022   Moderate persistent asthma without complication 11/02/2022   Statin intolerance 11/02/2022   Eustachian tube dysfunction, bilateral 11/02/2022   Familial hyperlipidemia 10/10/2017   Pre-diabetes 08/18/2016   Morbid obesity (HCC) 04/05/2016   Hallux limitus 12/21/2015   Migraine without aura, not intractable 12/23/2014   Controlled insomnia 12/23/2014   Hyperlipidemia 12/23/2014   Allergic rhinitis 12/23/2014   History of shingles 12/23/2014   History of postmenopausal bleeding 12/23/2014   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 Ardelle Anton   TUBAL LIGATION      OB History   No obstetric history on file.      Home Medications    Prior to Admission medications   Medication Sig Start Date End Date Taking? Authorizing Provider  famotidine (PEPCID) 20 MG tablet Take 1 tablet (20 mg total) by mouth 2 (two) times daily. 04/25/23  Yes Kennedy Bohanon K, PA-C  predniSONE (STERAPRED UNI-PAK 21 TAB) 10 MG (21) TBPK tablet As directed 04/25/23  Yes Wray Goehring K, PA-C  Albuterol Sulfate, sensor, (PROAIR DIGIHALER) 108 (90 Base) MCG/ACT AEPB Inhale 2 puffs into the lungs 4 (four) times daily as needed. 11/02/22   Alba Cory, MD  azelastine (ASTELIN) 0.1 % nasal spray Place 2 sprays into both nostrils 2 (two) times daily. Use in each nostril as directed 06/16/22   Alba Cory, MD  cetirizine (ZYRTEC) 10 MG tablet Take 10 mg by mouth daily.    [provider]  cholecalciferol (VITAMIN D3) 25 MCG (1000 UNIT) tablet Take 1,000 Units by mouth daily.    [provider]  Fluticasone-Umeclidin-Vilant (TRELEGY ELLIPTA) 100-62.5-25 MCG/ACT AEPB Inhale 1 puff into the lungs daily. 11/02/22   Alba Cory, MD  montelukast (SINGULAIR) 10 MG tablet TAKE 1 TABLET(10 MG) BY MOUTH AT BEDTIME 11/02/22   Alba Cory, MD  pravastatin (PRAVACHOL) 40 MG tablet Take 1 tablet (40 mg total) by mouth daily. 11/02/22   Alba Cory, MD  SUMAtriptan 6 MG/0.5ML SOAJ INJECT 0.5 ML(6MG )  UNDER THE SKIN AS DIRECTED. DO NOT EXCEED 2 INJECTIONS IN 2 HOURS 11/02/22   Alba Cory, MD  tirzepatide [facility]) 2.5 MG/0.5ML Pen Inject 2.5 mg into the skin once a week. 11/02/22   Alba Cory, MD  topiramate (TOPAMAX) 50 MG tablet TAKE 1 TO 2 TABLETS(50 TO 100 MG) BY MOUTH TWICE DAILY 03/07/23   Carlynn Purl, Danna Hefty, MD  valACYclovir (VALTREX) 500 MG tablet TAKE 1 TABLET(500 MG) BY MOUTH DAILY 11/02/22   Alba Cory, MD  Vitamin D, Ergocalciferol, (DRISDOL) 1.25 MG (50000  UNIT) CAPS capsule Take 1 capsule (50,000 Units total) by mouth every 7 (seven) days. 11/02/22   Alba Cory, MD    Family History Family History  Problem Relation Age of Onset   Hypertension Mother    Dementia Mother    Heart disease Mother    Heart attack Father    Heart attack Paternal Uncle    Heart attack Paternal Grandfather    Colitis Sister    Diabetes Sister     Social History Social History   Tobacco Use   Smoking status: Never   Smokeless tobacco: Never  Vaping Use   Vaping status: Never Used  Substance Use Topics   Alcohol use: Yes    Alcohol/week: 0.0 standard drinks of alcohol    Comment: occasionally   Drug use: No     Allergies   Penicillins, Bactrim [sulfamethoxazole-trimethoprim], Statins, Topamax [topiramate], Tree extract, and Victoza [liraglutide]   Review of Systems Review of Systems  Constitutional:  Positive for activity change. Negative for appetite change, fatigue and fever.  Respiratory:  Negative for cough and shortness of breath.   Cardiovascular:  Negative for chest pain.  Gastrointestinal:  Negative for abdominal pain, diarrhea, nausea and vomiting.  Skin:  Positive for rash.  Neurological:  Negative for dizziness, light-headedness and headaches.     Physical Exam Triage Vital Signs ED Triage Vitals  Encounter Vitals Group     BP 04/25/23 0953 127/77     Systolic BP Percentile --      Diastolic BP Percentile --      Pulse Rate 04/25/23 0953 84     Resp 04/25/23 0953 18     Temp 04/25/23 0953 97.7 F (36.5 C)     Temp src --      SpO2 04/25/23 0953 96 %     Weight --      Height --      Head Circumference --      Peak Flow --      Pain Score 04/25/23 0955 0     Pain Loc --      Pain Education --      Exclude from Growth Chart --    No data found.  Updated Vital Signs BP 127/77   Pulse 84   Temp 97.7 F (36.5 C)   Resp 18   SpO2 96%   Visual Acuity Right Eye Distance:   Left Eye Distance:   Bilateral  Distance:    Right Eye Near:   Left Eye Near:    Bilateral Near:     Physical Exam Vitals reviewed.  Constitutional:      General: She is awake. She is not in acute distress.    Appearance: Normal appearance. She is well-developed. She is not ill-appearing.     Comments: Very pleasant female appears stated age in no acute distress sitting comfortably in exam room  HENT:     Head: Normocephalic and atraumatic.  Cardiovascular:  Rate and Rhythm: Normal rate and regular rhythm.     Heart sounds: Normal heart sounds, S1 normal and S2 normal. No murmur heard. Pulmonary:     Effort: Pulmonary effort is normal.     Breath sounds: Normal breath sounds. No wheezing, rhonchi or rales.     Comments: Clear to auscultation bilaterally. Abdominal:     General: Bowel sounds are normal.     Palpations: Abdomen is soft.     Tenderness: There is no abdominal tenderness. There is no right CVA tenderness, left CVA tenderness, guarding or rebound.  Skin:    Findings: Rash present. Rash is macular, papular and urticarial.     Comments: Widespread maculopapular rash with urticarial lesions noted trunk and extremities with evidence of excoriation.  Psychiatric:        Behavior: Behavior is cooperative.      UC Treatments / Results  Labs (all labs ordered are listed, but only abnormal results are displayed) Labs Reviewed - No data to display  EKG   Radiology No results found.  Procedures Procedures (including critical care time)  Medications Ordered in UC Medications  methylPREDNISolone acetate (DEPO-MEDROL) injection 80 mg (80 mg Intramuscular Given 04/25/23 1029)    Initial Impression / Assessment and Plan / UC Course  I have reviewed the triage vital signs and the nursing notes.  Pertinent labs & imaging results that were available during my care of the patient were reviewed by me and considered in my medical decision making (see chart for details).     Patient is  well-appearing, afebrile, nontoxic, nontachycardic.  Rash is consistent allergic urticaria.  Bactrim DS was added to her allergy list and discussed that she should inform all other providers of this allergy.  She was given Depo-Medrol in clinic with improvement of symptoms.  Will start prednisone taper tomorrow (04/26/2023).  We discussed that she should not take NSAIDs with this medication but can use acetaminophen/Tylenol as needed.  She takes cetirizine daily was encouraged to continue this medication but will add H2 blockade as well.  Famotidine sent to pharmacy.  She is to use hypoallergenic soaps and detergents.  Discussed that if she has any changing or worsening symptoms she needs to be seen immediately.  Strict return precautions given.  Work excuse note provided.  Final Clinical Impressions(s) / UC Diagnoses   Final diagnoses:  Allergic urticaria     Discharge Instructions      I am glad that you are starting to feel better with the medication.  Start prednisone taper tomorrow (04/26/2023) do not take NSAIDs with this medication including aspirin, ibuprofen/Advil, naproxen/Aleve.  You can use acetaminophen/Tylenol as needed.  Continue cetirizine daily.  Take famotidine twice daily.  We have added Bactrim DS to your allergy list but make sure that all of your other providers know this as well.  If you develop any swelling of your throat, shortness of breath, muffled voice, nausea, vomiting, worsening rash you need to be seen immediately.     ED Prescriptions     Medication Sig Dispense Auth. Provider   predniSONE (STERAPRED UNI-PAK 21 TAB) 10 MG (21) TBPK tablet As directed 21 tablet Enriqueta Augusta K, PA-C   famotidine (PEPCID) 20 MG tablet Take 1 tablet (20 mg total) by mouth 2 (two) times daily. 30 tablet Yena Tisby, Noberto Retort, PA-C      PDMP not reviewed this encounter.   Jeani Hawking, PA-C 04/25/23 1119

## 2023-05-02 NOTE — Progress Notes (Unsigned)
Name: Gail Ross   MRN: [id number]    DOB: [birth year]   Date:05/02/2023       Progress Note  Subjective  Chief Complaint  Follow Up  I connected with  Gail Ross  on 05/02/23 at  7:40 AM EDT by a video enabled telemedicine application and verified that I am speaking with the correct person using two identifiers.  I discussed the limitations of evaluation and management by telemedicine and the availability of in person appointments. The patient expressed understanding and agreed to proceed with the virtual visit  Staff also discussed with the patient that there may be a patient responsible charge related to this service. Patient Location: *** Provider Location: *** Additional Individuals present: ***  HPI  Migraine headache: Migraine episodes are described as throbbing sensation usually right side of head, from nuchal area and radiates to frontal area, associated with photophobia, no phonophobia. She also has nausea, no vomiting.  She was on Ajoby but stopped due to cost, switched to [place] and was doing well, but insurance stopped paying after two refills , currently having a lot of episodes, worse when stressed at work, we will try topamax again and see if she can tolerate it nwo  Familial hyperlipidemia: she was taking Crestor 40 mg since 03/2016 , but has noticed a lot muscle pain, spasms, we changed to 20 mg and added Co-Q 10 however leg pain got worse and she stopped in 2020, we changed to Zetia and it was too costly, insurance denied Nexlizet. She is willing to try another statin at this time   Asthma Moderate: she used to see Dr. Jenne Campus. She has been on [facility] and not responding., currently on Trelegy and cough and wheezing under control, she uses rescue occasionally   Recurrent sinusitis: . She used to see Dr. Jenne Campus , she needs to go back for re-evaluation. She is having nasal congestion and rhinorrhea    Vitamin D and B12 deficiency: taking supplementations     Pre-diabetes: there is a family history of diabetes, she denies polyphagia, polydipsia or polyuria.   Morbid obesity: BMI is over 35 with co-morbidities such as hyperlipidemia and pre diabetes. She denies personal history of pancreatitis or family history of thyroid cancer. She has increased intake of fruit and vegetables and cutting down on fast food. Having more like a low carb and high in protein . She really wants to lose weight.   History of shingles: continue valtrex, discussed shingles vaccine  Patient Active Problem List   Diagnosis Date Noted   Vitamin D deficiency 11/02/2022   Perennial allergic rhinitis with seasonal variation 11/02/2022   Vitamin B12 deficiency 11/02/2022   Moderate persistent asthma without complication 11/02/2022   Statin intolerance 11/02/2022   Eustachian tube dysfunction, bilateral 11/02/2022   Familial hyperlipidemia 10/10/2017   Pre-diabetes 08/18/2016   Morbid obesity (HCC) 04/05/2016   Hallux limitus 12/21/2015   Migraine without aura, not intractable 12/23/2014   Controlled insomnia 12/23/2014   Hyperlipidemia 12/23/2014   Allergic rhinitis 12/23/2014   History of shingles 12/23/2014   History of postmenopausal bleeding 12/23/2014   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 Ardelle Anton   TUBAL LIGATION      Family History  Problem Relation Age of Onset   Hypertension Mother    Dementia Mother  Heart disease Mother    Heart attack Father    Heart attack Paternal Uncle    Heart attack Paternal Grandfather    Colitis Sister    Diabetes Sister     Social History   Socioeconomic History   Marital status: Divorced    Spouse name: Not on file   Number of children: 0   Years of education: Not on file   Highest education level: Some college, no degree  Occupational History   Occupation:  Airline pilot    Comment: [name]  Tobacco Use   Smoking status: Never   Smokeless tobacco: Never  Vaping Use   Vaping status: Never Used  Substance and Sexual Activity   Alcohol use: Yes    Alcohol/week: 0.0 standard drinks of alcohol    Comment: occasionally   Drug use: No   Sexual activity: Not Currently  Other Topics Concern   Not on file  Social History Narrative   Moved to [place] because of new job, but has to commute to [employer].    Lives alone.    Married but not together- never got a divorce   Social Determinants of Corporate investment banker Strain: Low Risk  (05/31/2019)   Overall Financial Resource Strain (CARDIA)    Difficulty of Paying Living Expenses: Not hard at all  Food Insecurity: No Food Insecurity (05/31/2019)   Hunger Vital Sign    Worried About Running Out of Food in the Last Year: Never true    Ran Out of Food in the Last Year: Never true  Transportation Needs: No Transportation Needs (05/31/2019)   PRAPARE - Administrator, Civil Service (Medical): No    Lack of Transportation (Non-Medical): No  Physical Activity: Inactive (05/31/2019)   Exercise Vital Sign    Days of Exercise per Week: 0 days    Minutes of Exercise per Session: 0 min  Stress: No Stress Concern Present (05/31/2019)   [employer] of Occupational Health - Occupational Stress Questionnaire    Feeling of Stress : Not at all  Social Connections: Moderately Isolated (05/31/2019)   Social Connection and Isolation Panel [NHANES]    Frequency of Communication with Friends and Family: More than three times a week    Frequency of Social Gatherings with Friends and Family: More than three times a week    Attends Religious Services: More than 4 times per year    Active Member of [employer] or Organizations: No    Attends Banker Meetings: Never    Marital Status: Separated  Intimate Partner Violence: Not At Risk (05/31/2019)   Humiliation, Afraid,  Rape, and Kick questionnaire    Fear of Current or Ex-Partner: No    Emotionally Abused: No    Physically Abused: No    Sexually Abused: No     Current Outpatient Medications:    Albuterol Sulfate, sensor, (PROAIR DIGIHALER) 108 (90 Base) MCG/ACT AEPB, Inhale 2 puffs into the lungs 4 (four) times daily as needed., Disp: 1 each, Rfl: 1   azelastine (ASTELIN) 0.1 % nasal spray, Place 2 sprays into both nostrils 2 (two) times daily. Use in each nostril as directed, Disp: 30 mL, Rfl: 2   cetirizine (ZYRTEC) 10 MG tablet, Take 10 mg by mouth daily., Disp: , Rfl:    cholecalciferol (VITAMIN D3) 25 MCG (1000 UNIT) tablet, Take 1,000 Units by mouth daily., Disp: , Rfl:    famotidine (PEPCID) 20 MG tablet, Take 1 tablet (20 mg total)  by mouth 2 (two) times daily., Disp: 30 tablet, Rfl: 0   Fluticasone-Umeclidin-Vilant (TRELEGY ELLIPTA) 100-62.5-25 MCG/ACT AEPB, Inhale 1 puff into the lungs daily., Disp: 1 each, Rfl: 5   montelukast (SINGULAIR) 10 MG tablet, TAKE 1 TABLET(10 MG) BY MOUTH AT BEDTIME, Disp: 90 tablet, Rfl: 1   pravastatin (PRAVACHOL) 40 MG tablet, Take 1 tablet (40 mg total) by mouth daily., Disp: 90 tablet, Rfl: 1   predniSONE (STERAPRED UNI-PAK 21 TAB) 10 MG (21) TBPK tablet, As directed, Disp: 21 tablet, Rfl: 0   SUMAtriptan 6 MG/0.5ML SOAJ, INJECT 0.5 ML(6MG ) UNDER THE SKIN AS DIRECTED. DO NOT EXCEED 2 INJECTIONS IN 2 HOURS, Disp: 5 mL, Rfl: 5   tirzepatide (ZEPBOUND) 2.5 MG/0.5ML Pen, Inject 2.5 mg into the skin once a week., Disp: 2 mL, Rfl: 0   topiramate (TOPAMAX) 50 MG tablet, TAKE 1 TO 2 TABLETS(50 TO 100 MG) BY MOUTH TWICE DAILY, Disp: 180 tablet, Rfl: 0   valACYclovir (VALTREX) 500 MG tablet, TAKE 1 TABLET(500 MG) BY MOUTH DAILY, Disp: 90 tablet, Rfl: 1   Vitamin D, Ergocalciferol, (DRISDOL) 1.25 MG (50000 UNIT) CAPS capsule, Take 1 capsule (50,000 Units total) by mouth every 7 (seven) days., Disp: 12 capsule, Rfl: 1  Allergies  Allergen Reactions   Penicillins Anaphylaxis  and Hives   Bactrim [Sulfamethoxazole-Trimethoprim] Hives   Statins     Muscle ache   Topamax [Topiramate]    Tree Extract Other (See Comments)    Sneezing    Victoza [Liraglutide]     Indigestion and worsening of migraines    I personally reviewed active problem list, medication list, allergies, family history, social history, health maintenance with the patient/caregiver today.   ROS ***  Objective  Virtual encounter, vitals not obtained.  There is no height or weight on file to calculate BMI.  Physical Exam ***  No results found for this or any previous visit (from the past 72 hour(s)).  PHQ2/9:    11/02/2022   10:15 AM 06/16/2022    8:36 AM 05/14/2022   11:39 AM 02/12/2022    2:16 PM 11/30/2021    8:09 AM  Depression screen PHQ 2/9  Decreased Interest 0 0 0 0 0  Down, Depressed, Hopeless 0 0 0 0 0  PHQ - 2 Score 0 0 0 0 0  Altered sleeping 0 0 0 0   Tired, decreased energy 0 0 0 0   Change in appetite 0 0 0 0   Feeling bad or failure about yourself  0 0 0 0   Trouble concentrating 0 0 0 0   Moving slowly or fidgety/restless 0 0 0 0   Suicidal thoughts 0 0 0 0   PHQ-9 Score 0 0 0 0   Difficult doing work/chores   Not difficult at all     PHQ-2/9 Result is {gen negative/positive:315881}.    Fall Risk:    11/02/2022   10:15 AM 06/16/2022    8:36 AM 05/14/2022   11:39 AM 02/12/2022    2:15 PM 11/30/2021    8:09 AM  Fall Risk   Falls in the past year? 0 0 0 0 0  Number falls in past yr: 0 0 0 0   Injury with Fall? 0 0 0 0   Risk for fall due to : No Fall Risks No Fall Risks  No Fall Risks No Fall Risks  Follow up Falls prevention discussed Falls prevention discussed  Falls prevention discussed Falls prevention discussed  Assessment & Plan  There are no diagnoses linked to this encounter.  I discussed the assessment and treatment plan with the patient. The patient was provided an opportunity to ask questions and all were answered. The patient agreed with  the plan and demonstrated an understanding of the instructions.  The patient was advised to call back or seek an in-person evaluation if the symptoms worsen or if the condition fails to improve as anticipated.  I provided *** minutes of non-face-to-face time during this encounter.

## 2023-05-03 ENCOUNTER — Telehealth (INDEPENDENT_AMBULATORY_CARE_PROVIDER_SITE_OTHER): Payer: Self-pay | Admitting: Family Medicine

## 2023-05-03 ENCOUNTER — Telehealth: Payer: Self-pay | Admitting: Family Medicine

## 2023-05-03 DIAGNOSIS — Z91199 Patient's noncompliance with other medical treatment and regimen due to unspecified reason: Secondary | ICD-10-CM

## 2023-05-26 ENCOUNTER — Encounter: Payer: BC Managed Care – PPO | Admitting: Family Medicine

## 2023-06-28 ENCOUNTER — Telehealth: Payer: BC Managed Care – PPO | Admitting: Family Medicine

## 2023-07-18 ENCOUNTER — Other Ambulatory Visit: Payer: Self-pay | Admitting: Family Medicine

## 2023-07-18 DIAGNOSIS — Z8619 Personal history of other infectious and parasitic diseases: Secondary | ICD-10-CM

## 2023-07-22 ENCOUNTER — Telehealth: Payer: BC Managed Care – PPO | Admitting: Family Medicine

## 2023-07-30 ENCOUNTER — Other Ambulatory Visit: Payer: Self-pay | Admitting: Family Medicine

## 2023-07-30 DIAGNOSIS — Z8619 Personal history of other infectious and parasitic diseases: Secondary | ICD-10-CM

## 2023-08-08 ENCOUNTER — Encounter: Payer: Self-pay | Admitting: Family Medicine

## 2023-08-08 ENCOUNTER — Telehealth (INDEPENDENT_AMBULATORY_CARE_PROVIDER_SITE_OTHER): Payer: BC Managed Care – PPO | Admitting: Family Medicine

## 2023-08-08 DIAGNOSIS — J4540 Moderate persistent asthma, uncomplicated: Secondary | ICD-10-CM | POA: Diagnosis not present

## 2023-08-08 DIAGNOSIS — J302 Other seasonal allergic rhinitis: Secondary | ICD-10-CM

## 2023-08-08 DIAGNOSIS — E559 Vitamin D deficiency, unspecified: Secondary | ICD-10-CM

## 2023-08-08 DIAGNOSIS — Z1211 Encounter for screening for malignant neoplasm of colon: Secondary | ICD-10-CM

## 2023-08-08 DIAGNOSIS — Z789 Other specified health status: Secondary | ICD-10-CM

## 2023-08-08 DIAGNOSIS — E7849 Other hyperlipidemia: Secondary | ICD-10-CM

## 2023-08-08 DIAGNOSIS — G43009 Migraine without aura, not intractable, without status migrainosus: Secondary | ICD-10-CM | POA: Diagnosis not present

## 2023-08-08 MED ORDER — SUMATRIPTAN SUCCINATE 6 MG/0.5ML ~~LOC~~ SOAJ: SUBCUTANEOUS | 2 refills | Status: DC

## 2023-08-08 MED ORDER — QULIPTA 30 MG PO TABS
1.0000 | ORAL_TABLET | ORAL | 2 refills | Status: DC
Start: 2023-08-08 — End: 2024-02-23

## 2023-08-08 MED ORDER — TOPIRAMATE 100 MG PO TABS
100.0000 mg | ORAL_TABLET | Freq: Two times a day (BID) | ORAL | 0 refills | Status: DC
Start: 2023-08-08 — End: 2024-02-23

## 2023-08-08 NOTE — Progress Notes (Signed)
Name: Gail Ross   MRN: [id number]    DOB: 01-24-1961   Date:08/08/2023       Progress Note  Subjective  Chief Complaint  Chief Complaint  Patient presents with   Medical Management of Chronic Issues    I connected with  Gail Ross  on 08/08/23 at  9:00 AM EST by a video enabled telemedicine application and verified that I am speaking with the correct person using two identifiers.  I discussed the limitations of evaluation and management by telemedicine and the availability of in person appointments. The patient expressed understanding and agreed to proceed with the virtual visit  Staff also discussed with the patient that there may be a patient responsible charge related to this service. Patient Location: at work  Provider Location: [facility] Additional Individuals present: alone   HPI   Migraine headache: Migraine episodes are described as throbbing sensation usually right side of head, from right nuchal area and radiates to frontal area, associated with photophobia and occasionally phonophobia. She also has nausea, no vomiting.  She was on Ajovy but stopped due to cost, switched to [place] and was doing well, but insurance stopped paying after two refills , she states insurance only pays for Imitrex nasally for acute episodes. She states she has to take all 10 shots each month. She states she has migraine days of 15-16 per month, even on Topamax her symptoms are not controlled, we will adjust dose of topamax and try PA for Qulipta   Familial hyperlipidemia: she was taking Crestor 40 mg since 03/2016 , but has noticed a lot muscle pain, spasms, we changed to 20 mg and added Co-Q 10 however leg pain got worse and she stopped in 2020, we changed to Zetia and it was too costly, insurance denied Nexlizet. She took Pravastatin and tolerated it well but ran out of medication since last visit    Asthma Moderate: she used to see Dr. Jenne Campus. She has been on [facility] and not responding.,  currently on Trelegy and cough and wheezing under control, she uses rescue occasionally she needs a refill    Recurrent sinusitis: . She used to see Dr. Jenne Campus , she needs to go back for re-evaluation. She is having nasal congestion and rhinorrhea, she was advised to go back on allergy shots but not covered by insurance   Vitamin D and B12 deficiency: taking supplementations    Pre-diabetes: there is a family history of diabetes, she denies polyphagia, polydipsia or polyuria.    Morbid obesity: BMI is over 35 with co-morbidities such as hyperlipidemia and pre diabetes. She denies personal history of pancreatitis or family history of thyroid cancer. We tried Zepbound but not covered by insurance, she is trying to be more active  History of shingles: continue valtrex, reminded her to had shingles vaccine. First out break was 2014   Patient Active Problem List   Diagnosis Date Noted   Vitamin D deficiency 11/02/2022   Perennial allergic rhinitis with seasonal variation 11/02/2022   Vitamin B12 deficiency 11/02/2022   Moderate persistent asthma without complication 11/02/2022   Statin intolerance 11/02/2022   Eustachian tube dysfunction, bilateral 11/02/2022   Familial hyperlipidemia 10/10/2017   Pre-diabetes 08/18/2016   Morbid obesity (HCC) 04/05/2016   Hallux limitus 12/21/2015   Migraine without aura, not intractable 12/23/2014   Controlled insomnia 12/23/2014   Hyperlipidemia 12/23/2014   Allergic rhinitis 12/23/2014   History of shingles 12/23/2014   History of postmenopausal bleeding 12/23/2014   HSV  epithelial keratitis 05/30/2013    Past Surgical History:  Procedure Laterality Date   BREAST BIOPSY Left    CERVICAL POLYPECTOMY  07/26/12   halux implant arthro Right 01/21/2016   Dr. Ardelle Anton   TUBAL LIGATION      Family History  Problem Relation Age of Onset   Hypertension Mother    Dementia Mother    Heart disease Mother    Heart attack Father    Heart attack  Paternal Uncle    Heart attack Paternal Grandfather    Colitis Sister    Diabetes Sister     Social History   Socioeconomic History   Marital status: Divorced    Spouse name: Not on file   Number of children: 0   Years of education: Not on file   Highest education level: Some college, no degree  Occupational History   Occupation: Airline pilot    Comment: [name]  Tobacco Use   Smoking status: Never   Smokeless tobacco: Never  Vaping Use   Vaping status: Never Used  Substance and Sexual Activity   Alcohol use: Yes    Alcohol/week: 0.0 standard drinks of alcohol    Comment: occasionally   Drug use: No   Sexual activity: Not Currently  Other Topics Concern   Not on file  Social History Narrative   Moved to [place] because of new job, but has to commute to [employer].    Lives alone.    Married but not together- never got a divorce   Social Drivers of Corporate investment banker Strain: Low Risk  (05/31/2019)   Overall Financial Resource Strain (CARDIA)    Difficulty of Paying Living Expenses: Not hard at all  Food Insecurity: No Food Insecurity (05/31/2019)   Hunger Vital Sign    Worried About Running Out of Food in the Last Year: Never true    Ran Out of Food in the Last Year: Never true  Transportation Needs: No Transportation Needs (05/31/2019)   PRAPARE - Administrator, Civil Service (Medical): No    Lack of Transportation (Non-Medical): No  Physical Activity: Inactive (05/31/2019)   Exercise Vital Sign    Days of Exercise per Week: 0 days    Minutes of Exercise per Session: 0 min  Stress: No Stress Concern Present (05/31/2019)   [employer] of Occupational Health - Occupational Stress Questionnaire    Feeling of Stress : Not at all  Social Connections: Moderately Isolated (05/31/2019)   Social Connection and Isolation Panel [NHANES]    Frequency of Communication with Friends and Family: More than three times a week    Frequency  of Social Gatherings with Friends and Family: More than three times a week    Attends Religious Services: More than 4 times per year    Active Member of [employer] or Organizations: No    Attends Banker Meetings: Never    Marital Status: Separated  Intimate Partner Violence: Not At Risk (05/31/2019)   Humiliation, Afraid, Rape, and Kick questionnaire    Fear of Current or Ex-Partner: No    Emotionally Abused: No    Physically Abused: No    Sexually Abused: No     Current Outpatient Medications:    Albuterol Sulfate, sensor, (PROAIR DIGIHALER) 108 (90 Base) MCG/ACT AEPB, Inhale 2 puffs into the lungs 4 (four) times daily as needed., Disp: 1 each, Rfl: 1   cetirizine (ZYRTEC) 10 MG tablet, Take 10 mg by mouth daily., Disp: ,  Rfl:    cholecalciferol (VITAMIN D3) 25 MCG (1000 UNIT) tablet, Take 1,000 Units by mouth daily., Disp: , Rfl:    Fluticasone-Umeclidin-Vilant (TRELEGY ELLIPTA) 100-62.5-25 MCG/ACT AEPB, Inhale 1 puff into the lungs daily., Disp: 1 each, Rfl: 5   montelukast (SINGULAIR) 10 MG tablet, TAKE 1 TABLET(10 MG) BY MOUTH AT BEDTIME, Disp: 90 tablet, Rfl: 1   pravastatin (PRAVACHOL) 40 MG tablet, Take 1 tablet (40 mg total) by mouth daily., Disp: 90 tablet, Rfl: 1   SUMAtriptan 6 MG/0.5ML SOAJ, INJECT 0.5 ML(6MG ) UNDER THE SKIN AS DIRECTED. DO NOT EXCEED 2 INJECTIONS IN 2 HOURS, Disp: 5 mL, Rfl: 5   topiramate (TOPAMAX) 50 MG tablet, TAKE 1 TO 2 TABLETS(50 TO 100 MG) BY MOUTH TWICE DAILY, Disp: 180 tablet, Rfl: 0   valACYclovir (VALTREX) 500 MG tablet, TAKE 1 TABLET(500 MG) BY MOUTH DAILY, Disp: 30 tablet, Rfl: 0  Allergies  Allergen Reactions   Penicillins Anaphylaxis and Hives   Bactrim [Sulfamethoxazole-Trimethoprim] Hives   Statins     Muscle ache   Tree Extract Other (See Comments)    Sneezing    Victoza [Liraglutide]     Indigestion and worsening of migraines    I personally reviewed active problem list, medication list, allergies, family history with  the patient/caregiver today.   ROS  Ten systems reviewed and is negative except as mentioned in HPI    Objective  Virtual encounter, vitals not obtained.  There is no height or weight on file to calculate BMI.  Physical exam    PHQ2/9:    08/08/2023    8:57 AM 05/03/2023    7:34 AM 11/02/2022   10:15 AM 06/16/2022    8:36 AM 05/14/2022   11:39 AM  Depression screen PHQ 2/9  Decreased Interest 0 0 0 0 0  Down, Depressed, Hopeless 0 0 0 0 0  PHQ - 2 Score 0 0 0 0 0  Altered sleeping 0 0 0 0 0  Tired, decreased energy 0 0 0 0 0  Change in appetite 0 0 0 0 0  Feeling bad or failure about yourself  0 0 0 0 0  Trouble concentrating 0 0 0 0 0  Moving slowly or fidgety/restless 0 0 0 0 0  Suicidal thoughts 0 0 0 0 0  PHQ-9 Score 0 0 0 0 0  Difficult doing work/chores Not difficult at all    Not difficult at all   PHQ-2/9 Result is negative.    Fall Risk:    08/08/2023    8:57 AM 05/03/2023    7:34 AM 11/02/2022   10:15 AM 06/16/2022    8:36 AM 05/14/2022   11:39 AM  Fall Risk   Falls in the past year? 0 0 0 0 0  Number falls in past yr: 0 0 0 0 0  Injury with Fall? 0 0 0 0 0  Risk for fall due to : No Fall Risks No Fall Risks No Fall Risks No Fall Risks   Follow up Falls prevention discussed;Education provided;Falls evaluation completed Falls prevention discussed Falls prevention discussed Falls prevention discussed      Assessment & Plan  1. Migraine without aura and without status migrainosus, not intractable (Primary)  Severe and more than 15 migraine days per month, we will adjust dose of topamax but needs to take another form of preventive medication. We will try Qulipita 30 mg daily and recheck in 3 months  - topiramate (TOPAMAX) 100 MG tablet; Take 1 tablet (100  mg total) by mouth 2 (two) times daily.  Dispense: 180 tablet; Refill: 0 - SUMAtriptan 6 MG/0.5ML SOAJ; INJECT 0.5 ML(6MG ) UNDER THE SKIN AS DIRECTED. DO NOT EXCEED 2 INJECTIONS IN 2 HOURS  Dispense: 5  mL; Refill: 2  2. Familial hyperlipidemia  She would like to resume pravastatin, we will recheck labs next visit   3. Morbid obesity (HCC)  Discussed with the patient the risk posed by an increased BMI. Discussed importance of portion control, calorie counting and at least 150 minutes of physical activity weekly. Avoid sweet beverages and drink more water. Eat at least 6 servings of fruit and vegetables daily   Insurance dose not cover weight loss medication   4. Moderate persistent asthma without complication  Doing well on Trelgey  5. Perennial allergic rhinitis with seasonal variation  Under the care of ENT  6.Vitamin D deficiency  Continue supplementation   7. Statin intolerance  Able to tolerated pravastatin now  8. Screening for colon cancer  - Cologuard    I discussed the assessment and treatment plan with the patient. The patient was provided an opportunity to ask questions and all were answered. The patient agreed with the plan and demonstrated an understanding of the instructions.  The patient was advised to call back or seek an in-person evaluation if the symptoms worsen or if the condition fails to improve as anticipated.  I provided 25 minutes of non-face-to-face time during this encounter.

## 2023-09-18 ENCOUNTER — Other Ambulatory Visit: Payer: Self-pay | Admitting: Family Medicine

## 2023-09-18 DIAGNOSIS — Z8619 Personal history of other infectious and parasitic diseases: Secondary | ICD-10-CM

## 2023-09-19 ENCOUNTER — Other Ambulatory Visit: Payer: Self-pay | Admitting: Family Medicine

## 2023-09-19 DIAGNOSIS — Z8619 Personal history of other infectious and parasitic diseases: Secondary | ICD-10-CM

## 2023-09-19 MED ORDER — VALACYCLOVIR HCL 500 MG PO TABS
500.0000 mg | ORAL_TABLET | Freq: Every day | ORAL | 0 refills | Status: AC
Start: 2023-09-19 — End: ?

## 2023-09-27 ENCOUNTER — Telehealth: Payer: Self-pay | Admitting: Family Medicine

## 2023-09-27 NOTE — Telephone Encounter (Signed)
 PA done waiting on insurance to determine

## 2023-09-27 NOTE — Telephone Encounter (Signed)
 Prior authorization from [employer]  Atogepant (QULIPTA) 30 MG TABS   Key: [id number]

## 2023-11-14 ENCOUNTER — Ambulatory Visit: Payer: Self-pay | Admitting: Family Medicine

## 2023-11-19 ENCOUNTER — Other Ambulatory Visit: Payer: Self-pay | Admitting: Family Medicine

## 2023-11-19 DIAGNOSIS — J4540 Moderate persistent asthma, uncomplicated: Secondary | ICD-10-CM

## 2023-11-21 NOTE — Telephone Encounter (Signed)
 Patient had a video visit in Jan 2025

## 2023-12-25 ENCOUNTER — Other Ambulatory Visit: Payer: Self-pay | Admitting: Family Medicine

## 2023-12-25 DIAGNOSIS — Z8619 Personal history of other infectious and parasitic diseases: Secondary | ICD-10-CM

## 2023-12-28 ENCOUNTER — Other Ambulatory Visit: Payer: Self-pay | Admitting: Family Medicine

## 2023-12-28 ENCOUNTER — Encounter: Payer: Self-pay | Admitting: Family Medicine

## 2023-12-28 DIAGNOSIS — Z8619 Personal history of other infectious and parasitic diseases: Secondary | ICD-10-CM

## 2023-12-28 MED ORDER — VALACYCLOVIR HCL 500 MG PO TABS
500.0000 mg | ORAL_TABLET | Freq: Every day | ORAL | 0 refills | Status: DC
Start: 2023-12-28 — End: 2024-02-18

## 2024-02-18 ENCOUNTER — Ambulatory Visit: Payer: Self-pay

## 2024-02-18 ENCOUNTER — Telehealth: Payer: Self-pay | Admitting: Nurse Practitioner

## 2024-02-18 DIAGNOSIS — B0231 Zoster conjunctivitis: Secondary | ICD-10-CM

## 2024-02-18 MED ORDER — VALACYCLOVIR HCL 500 MG PO TABS: 1000.0000 mg | ORAL_TABLET | Freq: Every day | ORAL | 0 refills | Status: DC

## 2024-02-18 NOTE — Progress Notes (Signed)
 I will not be able to prescribe for 2 weeks. I can send in 5 days worth and you can follow up with your PCP for additional refills. This would not be something that should handled through an evisit or virtual visit. I recommend sending a mychart message on Monday.   E-visit for Shingles   We are sorry that you are not feeling well. Here is how we plan to help!  Based on what you shared with me it looks like you have shingles.  Shingles or herpes zoster, is a common infection of the nerves.  It is a painful rash caused by the herpes zoster virus.  This is the same virus that causes chickenpox.  After a person has chickenpox, the virus remains inactive in the nerve cells.  Years later, the virus can become active again and travel to the skin.  It typically will appear on one side of the face or body.  Burning or shooting pain, tingling, or itching are early signs of the infection.  Blisters typically scab over in 7 to 10 days and clear up within 2-4 weeks. Shingles is only contagious to people that have never had the chickenpox, the chickenpox vaccine, or anyone who has a compromised immune system.  You should avoid contact with these type of people until your blisters scab over.  I have prescribed Valacyclovir  1g mg daily for 5 days  HOME CARE: Apply ice packs (wrapped in a thin towel), cool compresses, or soak in cool bath to help reduce pain. Use calamine lotion to calm itchy skin. Avoid scratching the rash. Avoid direct sunlight.  GET HELP RIGHT AWAY IF: Symptoms that don't away after treatment. A rash or blisters near your eye. Increased drainage, fever, or rash after treatment. Severe pain that doesn't go away.   MAKE SURE YOU   Understand these instructions. Will watch your condition. Will get help right away if you are not doing well or get worse.  Thank you for choosing an e-visit.  Your e-visit answers were reviewed by a board certified advanced clinical practitioner to  complete your personal care plan. Depending upon the condition, your plan could have included both over the counter or prescription medications.  Please review your pharmacy choice. Make sure the pharmacy is open so you can pick up prescription now. If there is a problem, you may contact your provider through [employer] and have the prescription routed to another pharmacy.  Your safety is important to us . If you have drug allergies check your prescription carefully.   For the next 24 hours you can use MyChart to ask questions about today's visit, request a non-urgent call back, or ask for a work or school excuse. You will get an email in the next two days asking about your experience. I hope that your e-visit has been valuable and will speed your recovery.

## 2024-02-18 NOTE — Progress Notes (Signed)
 I have spent 5 minutes in review of e-visit questionnaire, review and updating patient chart, medical decision making and response to patient.   Claiborne Rigg, NP

## 2024-02-19 ENCOUNTER — Ambulatory Visit

## 2024-02-21 ENCOUNTER — Other Ambulatory Visit: Payer: Self-pay | Admitting: Family Medicine

## 2024-02-21 ENCOUNTER — Encounter: Payer: Self-pay | Admitting: Family Medicine

## 2024-02-21 DIAGNOSIS — B0231 Zoster conjunctivitis: Secondary | ICD-10-CM

## 2024-02-23 ENCOUNTER — Encounter: Payer: Self-pay | Admitting: Family Medicine

## 2024-02-23 ENCOUNTER — Ambulatory Visit (INDEPENDENT_AMBULATORY_CARE_PROVIDER_SITE_OTHER): Admitting: Family Medicine

## 2024-02-23 VITALS — BP 126/80 | HR 90 | Resp 16 | Ht 64.0 in | Wt 212.2 lb

## 2024-02-23 DIAGNOSIS — E7849 Other hyperlipidemia: Secondary | ICD-10-CM

## 2024-02-23 DIAGNOSIS — E559 Vitamin D deficiency, unspecified: Secondary | ICD-10-CM | POA: Diagnosis not present

## 2024-02-23 DIAGNOSIS — J4550 Severe persistent asthma, uncomplicated: Secondary | ICD-10-CM | POA: Diagnosis not present

## 2024-02-23 DIAGNOSIS — Z79899 Other long term (current) drug therapy: Secondary | ICD-10-CM | POA: Diagnosis not present

## 2024-02-23 DIAGNOSIS — G43009 Migraine without aura, not intractable, without status migrainosus: Secondary | ICD-10-CM | POA: Diagnosis not present

## 2024-02-23 DIAGNOSIS — B0231 Zoster conjunctivitis: Secondary | ICD-10-CM | POA: Diagnosis not present

## 2024-02-23 DIAGNOSIS — J3089 Other allergic rhinitis: Secondary | ICD-10-CM

## 2024-02-23 DIAGNOSIS — J320 Chronic maxillary sinusitis: Secondary | ICD-10-CM

## 2024-02-23 DIAGNOSIS — Z23 Encounter for immunization: Secondary | ICD-10-CM

## 2024-02-23 DIAGNOSIS — E538 Deficiency of other specified B group vitamins: Secondary | ICD-10-CM | POA: Diagnosis not present

## 2024-02-23 DIAGNOSIS — J302 Other seasonal allergic rhinitis: Secondary | ICD-10-CM

## 2024-02-23 MED ORDER — SUMATRIPTAN SUCCINATE 6 MG/0.5ML ~~LOC~~ SOAJ: SUBCUTANEOUS | 2 refills | Status: AC

## 2024-02-23 MED ORDER — TRELEGY ELLIPTA 100-62.5-25 MCG/ACT IN AEPB: 1.0000 | INHALATION_SPRAY | Freq: Every day | RESPIRATORY_TRACT | 1 refills | Status: AC

## 2024-02-23 MED ORDER — EMGALITY 120 MG/ML ~~LOC~~ SOAJ: 1.0000 mg | SUBCUTANEOUS | 1 refills | Status: AC

## 2024-02-23 MED ORDER — VALACYCLOVIR HCL 1 G PO TABS
1000.0000 mg | ORAL_TABLET | Freq: Two times a day (BID) | ORAL | 0 refills | Status: AC
Start: 2024-02-23 — End: 2024-03-04

## 2024-02-23 MED ORDER — MONTELUKAST SODIUM 10 MG PO TABS: 10.0000 mg | ORAL_TABLET | Freq: Every day | ORAL | 1 refills | Status: AC

## 2024-02-23 MED ORDER — PROAIR DIGIHALER 108 (90 BASE) MCG/ACT IN AEPB
2.0000 | INHALATION_SPRAY | Freq: Four times a day (QID) | RESPIRATORY_TRACT | 1 refills | Status: DC | PRN
Start: 2024-02-23 — End: 2024-02-24

## 2024-02-23 MED ORDER — LEVOCETIRIZINE DIHYDROCHLORIDE 5 MG PO TABS
5.0000 mg | ORAL_TABLET | Freq: Every evening | ORAL | 1 refills | Status: AC
Start: 2024-02-23 — End: ?

## 2024-02-23 MED ORDER — PRAVASTATIN SODIUM 40 MG PO TABS: 40.0000 mg | ORAL_TABLET | Freq: Every day | ORAL | 1 refills | Status: AC

## 2024-02-23 MED ORDER — FLUTICASONE PROPIONATE 50 MCG/ACT NA SUSP
2.0000 | Freq: Every day | NASAL | 1 refills | Status: AC
Start: 2024-02-23 — End: ?

## 2024-02-23 MED ORDER — VALACYCLOVIR HCL 500 MG PO TABS: 500.0000 mg | ORAL_TABLET | Freq: Every day | ORAL | 1 refills | Status: AC

## 2024-02-23 NOTE — Progress Notes (Signed)
 Name: Gail Ross   MRN: [id number]    DOB: 1961-06-02   Date:02/23/2024       Progress Note  Subjective  Chief Complaint  Chief Complaint  Patient presents with   Medical Management of Chronic Issues   Discussed the use of AI scribe software for clinical note transcription with the patient, who gave verbal consent to proceed.  History of Present Illness Gail Ross is a 63 year old female who presents with a recurrence of shingles on her eye.  She experiences a recurrence of shingles affecting her right eye, specifically the conjunctiva, leading to blurry vision and redness. 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 Previously, she managed the condition with Valtrex  500 mg daily and during recent outbreak took Acyclovir 800 mg TID, which was effective in July 2025, but she has not seen an eye doctor recently due to lack of insurance. She now has Medicare coverage and willing to get another referral   She experiences chronic migraine headaches, typically on the right side, with throbbing pain, nausea, and light sensitivity. She previously used [place] but stopped due to cost and insurance issues. Topamax  is not tolerated due to side effects. She currently uses sumatriptan  injections for acute episodes, which can occur up to six times a month and last up to three days. Nausea is present with migraines, but no vomiting. Occasional phonophobia is noted.  She has moderate persistent asthma, currently experiencing increased wheezing and shortness of breath due to being out of her inhalers. She uses Trelegy and montelukast  for management, which are effective when taken regularly.  She reports symptoms of a sinus infection with dark yellow nasal discharge, despite taking allergy medications including Zyrtec and fluticasone . She has a history of chronic sinusitis.  She has a history of hyperlipidemia and takes  pravastatin . Her last blood work in December 2023 showed high cholesterol  Low B12 and Vitamin D  : currently not taking B12 or vitamin D  supplements.  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 She has two job interviews scheduled today and more next week.    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 Gershon   TUBAL LIGATION      Family History  Problem Relation Age of Onset   Hypertension Mother    Dementia Mother    Heart disease Mother    Heart attack Father    Heart attack Paternal Uncle    Heart attack Paternal Grandfather    Colitis Sister    Diabetes Sister     Social History   Tobacco Use  Smoking status: Never   Smokeless tobacco: Never  Substance Use Topics   Alcohol use: Yes    Alcohol/week: 0.0 standard drinks of alcohol    Comment: occasionally     Current Outpatient Medications:    Albuterol  Sulfate, sensor, (PROAIR  DIGIHALER) 108 (90 Base) MCG/ACT AEPB, Inhale 2 puffs into the lungs 4 (four) times daily as needed., Disp: 1 each, Rfl: 1   cetirizine (ZYRTEC) 10 MG tablet, Take 10 mg  by mouth daily., Disp: , Rfl:    cholecalciferol (VITAMIN D3) 25 MCG (1000 UNIT) tablet, Take 1,000 Units by mouth daily., Disp: , Rfl:    Fluticasone -Umeclidin-Vilant (TRELEGY ELLIPTA ) 100-62.5-25 MCG/ACT AEPB, INHALE 1 PUFF INTO THE LUNGS DAILY, Disp: 60 each, Rfl: 1   montelukast  (SINGULAIR ) 10 MG tablet, TAKE 1 TABLET(10 MG) BY MOUTH AT BEDTIME, Disp: 90 tablet, Rfl: 1   pravastatin  (PRAVACHOL ) 40 MG tablet, Take 1 tablet (40 mg total) by mouth daily., Disp: 90 tablet, Rfl: 1   SUMAtriptan  6 MG/0.5ML SOAJ, INJECT 0.5 ML(6MG ) UNDER THE SKIN AS DIRECTED. DO NOT EXCEED 2 INJECTIONS IN 2 HOURS, Disp: 5 mL, Rfl: 2   valACYclovir  (VALTREX ) 500 MG tablet, Take 2 tablets (1,000 mg total) by mouth daily for 5 days. TAKE 1 TABLET(500 MG) BY MOUTH DAILY, Disp: 10 tablet, Rfl: 0   Atogepant  (QULIPTA ) 30 MG TABS, Take 1 tablet (30 mg total) by mouth every morning. (Patient not taking: Reported on 02/23/2024), Disp: 30 tablet, Rfl: 2   topiramate  (TOPAMAX ) 100 MG tablet, Take 1 tablet (100 mg total) by mouth 2 (two) times daily. (Patient not taking: Reported on 02/23/2024), Disp: 180 tablet, Rfl: 0  Allergies  Allergen Reactions   Penicillins Anaphylaxis and Hives   Bactrim [Sulfamethoxazole-Trimethoprim] Hives   Statins     Muscle ache   Tree Extract Other (See Comments)    Sneezing    Victoza  [Liraglutide ]     Indigestion and worsening of migraines    I personally reviewed active problem list, medication list, allergies with the patient/caregiver today.   ROS  Ten systems reviewed and is negative except as mentioned in HPI    Objective Physical Exam CONSTITUTIONAL: Patient appears well-developed and well-nourished. No distress. HEENT: Head atraumatic, normocephalic, neck supple. White dot on cornea. Conjunctivitis in the right  eye. Oral cavity normal. CARDIOVASCULAR: Normal rate, regular rhythm and normal heart sounds. No murmur heard. No BLE edema. PULMONARY: Effort normal and breath  sounds normal. No respiratory distress. PSYCHIATRIC: Patient has a normal mood and affect. Behavior is normal. Judgment and thought content normal.  Vitals:   02/23/24 1118  BP: 126/80  Pulse: 90  Resp: 16  SpO2: 96%  Weight: 212 lb 3.2 oz (96.3 kg)  Height: 5' 4 (1.626 m)    Body mass index is 36.42 kg/m.  No results found for this or any previous visit (from the past 2160 hours).  Diabetic Foot Exam:     PHQ2/9:    02/23/2024   11:15 AM 08/08/2023    8:57 AM 05/03/2023    7:34 AM 11/02/2022   10:15 AM 06/16/2022    8:36 AM  Depression screen PHQ 2/9  Decreased Interest 0 0 0 0 0  Down, Depressed, Hopeless 0 0 0 0 0  PHQ - 2 Score 0 0 0 0 0  Altered sleeping 0 0 0 0 0  Tired, decreased energy 0 0 0 0 0  Change in appetite 0 0 0 0 0  Feeling bad or failure about yourself  0 0 0 0 0  Trouble concentrating 0 0 0 0 0  Moving slowly or fidgety/restless 0 0 0 0 0  Suicidal thoughts 0 0 0 0 0  PHQ-9 Score 0 0 0 0 0  Difficult doing work/chores Not difficult at all Not difficult at all       phq 9 is negative  Fall Risk:    02/23/2024   11:14 AM 08/08/2023    8:57 AM 05/03/2023    7:34 AM 11/02/2022   10:15 AM 06/16/2022    8:36 AM  Fall Risk   Falls in the past year? 0 0 0 0 0  Number falls in past yr: 0 0 0 0 0  Injury with Fall? 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 Previous effective use of Valtrex . - Prescribe Valacyclovir  1 gram twice daily for 10  days. - Refer to ophthalmologist for urgent eye examination. - Prescribe Valacyclovir  500 mg daily for maintenance after acute treatment.  Chronic migraine without aura Chronic migraines up to six times a month, effective use of sumatriptan  injections for acute episodes. Current insurance covers sumatriptan  and Emgality . - Prescribe Emgality  120 mg monthly for migraine prevention. - Prescribe sumatriptan  injection for acute migraine episodes. - Provide three-month supply of Emgality .  Severe persistent asthma Severe persistent asthma with daily symptoms, exacerbated by lack of medication. Current medications include Trelegy and montelukast . - Prescribe montelukast .   - Prescribe Trelegy 162.5/25 mcg, one puff daily. - Provide three-month supply of Trelegy and montelukast .  Chronic allergic rhinitis and chronic sinusitis Chronic allergic rhinitis and sinusitis with nasal congestion and yellow discharge. Effective use of fluticasone . - Prescribe fluticasone  nasal spray. - Switch from Zyrtec to Xyzal  for allergy management. - Consider Astelin  nasal spray if needed.  Hyperlipidemia Hyperlipidemia with previously high LDL levels. Current medication is pravastatin . No recent blood work. - Prescribe pravastatin . - Order lipid panel in one month.  Vitamin B12 deficiency Vitamin B12 deficiency with previously low levels. Currently not taking supplements. - Prescribe sublingual vitamin B12. - Recheck vitamin B12 levels in one month.  Vitamin D  deficiency Vitamin D  deficiency with previously low levels. Currently not taking supplements. - Prescribe vitamin D  supplementation. - Recheck vitamin D  levels in one month.

## 2024-02-24 ENCOUNTER — Other Ambulatory Visit: Payer: Self-pay | Admitting: Family Medicine

## 2024-02-24 DIAGNOSIS — J4550 Severe persistent asthma, uncomplicated: Secondary | ICD-10-CM

## 2024-03-08 ENCOUNTER — Encounter: Payer: Self-pay | Admitting: Family Medicine

## 2024-03-08 ENCOUNTER — Other Ambulatory Visit: Payer: Self-pay | Admitting: Family Medicine

## 2024-03-08 DIAGNOSIS — J4550 Severe persistent asthma, uncomplicated: Secondary | ICD-10-CM

## 2024-04-10 ENCOUNTER — Other Ambulatory Visit: Payer: Self-pay | Admitting: Family Medicine

## 2024-04-10 DIAGNOSIS — B0231 Zoster conjunctivitis: Secondary | ICD-10-CM

## 2024-04-12 ENCOUNTER — Encounter: Admitting: Family Medicine

## 2024-05-01 ENCOUNTER — Telehealth: Admitting: Emergency Medicine

## 2024-05-01 DIAGNOSIS — J019 Acute sinusitis, unspecified: Secondary | ICD-10-CM | POA: Diagnosis not present

## 2024-05-01 DIAGNOSIS — B9789 Other viral agents as the cause of diseases classified elsewhere: Secondary | ICD-10-CM | POA: Diagnosis not present

## 2024-05-01 MED ORDER — PREDNISONE 10 MG (21) PO TBPK: ORAL_TABLET | ORAL | 0 refills | Status: AC

## 2024-05-01 NOTE — Progress Notes (Signed)
 We are sorry that you are not feeling well.  Here is how we plan to help!  Based on what you have shared with me it looks like you have sinusitis.  Sinusitis is inflammation and infection in the sinus cavities of the head.  Based on your presentation I believe you most likely have Acute Viral Sinusitis.This is an infection most likely caused by a virus. There is not specific treatment for viral sinusitis other than to help you with the symptoms until the infection runs its course.    You may use an oral decongestant such as Mucinex D or if you have glaucoma or high blood pressure use plain Mucinex.   Saline nasal spray help and can safely be used as often as needed for congestion. Try using saline irrigation, such as with a neti pot, several times a day while you are sick. Many neti pots come with salt packets premeasured to use to make saline. If you use your own salt, make sure it is kosher salt or sea salt (don't use table salt as it has iodine in it and you don't need that in your nose). Use distilled water to make saline. If you mix your own saline using your own salt, the recipe is 1/4 teaspoon salt in 1 cup warm water. Using saline irrigation can help prevent and treat sinus infections.  I have prescribed: prednisone  as requested.   Some authorities believe that zinc sprays or the use of Echinacea may shorten the course of your symptoms.  Sinus infections are not as easily transmitted as other respiratory infection, however we still recommend that you avoid close contact with loved ones, especially the very young and elderly.  Remember to wash your hands thoroughly throughout the day as this is the number one way to prevent the spread of infection!  Home Care: Only take medications as instructed by your medical team. Do not take these medications with alcohol. A steam or ultrasonic humidifier can help congestion.  You can place a towel over your head and breathe in the steam from hot water  coming from a faucet. Avoid close contacts especially the very young and the elderly. Cover your mouth when you cough or sneeze. Always remember to wash your hands.  Get Help Right Away If: You develop worsening fever or sinus pain. You develop a severe head ache or visual changes. Your symptoms persist after you have completed your treatment plan.  Make sure you Understand these instructions. Will watch your condition. Will get help right away if you are not doing well or get worse.  Your e-visit answers were reviewed by a board certified advanced clinical practitioner to complete your personal care plan.  Depending on the condition, your plan could have included both over the counter or prescription medications.  If there is a problem please reply  once you have received a response from your provider.  Your safety is important to us .  If you have drug allergies check your prescription carefully.    You can use MyChart to ask questions about today's visit, request a non-urgent call back, or ask for a work or school excuse for 24 hours related to this e-Visit. If it has been greater than 24 hours you will need to follow up with your provider, or enter a new e-Visit to address those concerns.  You will get an e-mail in the next two days asking about your experience.  I hope that your e-visit has been valuable and will speed  your recovery. Thank you for using e-visits.  I have spent 5 minutes in review of e-visit questionnaire, review and updating patient chart, medical decision making and response to patient.   Jon Belt, PhD, FNP-BC

## 2024-05-06 ENCOUNTER — Other Ambulatory Visit: Payer: Self-pay | Admitting: Family Medicine

## 2024-05-06 DIAGNOSIS — J4550 Severe persistent asthma, uncomplicated: Secondary | ICD-10-CM

## 2024-05-08 ENCOUNTER — Other Ambulatory Visit (HOSPITAL_COMMUNITY): Payer: Self-pay

## 2024-05-30 ENCOUNTER — Telehealth: Admitting: Physician Assistant

## 2024-05-30 DIAGNOSIS — B9689 Other specified bacterial agents as the cause of diseases classified elsewhere: Secondary | ICD-10-CM | POA: Diagnosis not present

## 2024-05-30 DIAGNOSIS — J019 Acute sinusitis, unspecified: Secondary | ICD-10-CM

## 2024-05-30 MED ORDER — DOXYCYCLINE HYCLATE 100 MG PO TABS
100.0000 mg | ORAL_TABLET | Freq: Two times a day (BID) | ORAL | 0 refills | Status: AC
Start: 2024-05-30 — End: ?

## 2024-05-30 NOTE — Progress Notes (Signed)

## 2024-06-06 ENCOUNTER — Encounter: Admitting: Family Medicine

## 2024-06-11 ENCOUNTER — Other Ambulatory Visit: Payer: Self-pay | Admitting: Family Medicine

## 2024-06-11 DIAGNOSIS — J4550 Severe persistent asthma, uncomplicated: Secondary | ICD-10-CM

## 2024-06-12 ENCOUNTER — Encounter: Admitting: Family Medicine

## 2024-06-14 NOTE — Telephone Encounter (Signed)
 Requested medications are due for refill today.  yes  Requested medications are on the active medications list.  yes  Last refill. 03/08/2024 1 with 1 rf  Future visit scheduled.   yes  Notes to clinic.  Pharmacy comment: Alternative Requested:NON FORMULARY. PLEASE SEND ALTERNATIVE. THANKS.     Requested Prescriptions  Pending Prescriptions Disp Refills   PROAIR  DIGIHALER 108 (90 Base) MCG/ACT AEPB [Pharmacy Med Name: PROAIR  DIGIHALER 90 MCG INHALR] 1 each 1    Sig: INHALE 2 PUFFS INTO THE LUNGS 4 TIMES DAILY AS NEEDED.     Pulmonology:  Beta Agonists 2 Passed - 06/14/2024  3:34 PM      Passed - Last BP in normal range    BP Readings from Last 1 Encounters:  02/23/24 126/80         Passed - Last Heart Rate in normal range    Pulse Readings from Last 1 Encounters:  02/23/24 90         Passed - Valid encounter within last 12 months    Recent Outpatient Visits           3 months ago Herpes zoster conjunctivitis   [facility] Glenard Mire, MD       Future Appointments             In 2 months Glenard, Krichna, MD [facility] [place]

## 2024-08-27 ENCOUNTER — Ambulatory Visit: Admitting: Family Medicine
